# Patient Record
Sex: Female | Born: 1980 | Race: White | Hispanic: No | Smoking: Heavy tobacco smoker
Health system: Southern US, Community
[De-identification: ages and names within clinical notes are randomized; demographics above are authoritative.]

## PROBLEM LIST (undated history)

## (undated) DIAGNOSIS — F419 Anxiety disorder, unspecified: Secondary | ICD-10-CM

## (undated) DIAGNOSIS — I10 Essential (primary) hypertension: Secondary | ICD-10-CM

## (undated) DIAGNOSIS — F32A Depression, unspecified: Secondary | ICD-10-CM

## (undated) DIAGNOSIS — F329 Major depressive disorder, single episode, unspecified: Secondary | ICD-10-CM

## (undated) HISTORY — PX: CHOLECYSTECTOMY: SHX55

---

## 2001-01-17 ENCOUNTER — Other Ambulatory Visit: Admission: RE | Admit: 2001-01-17 | Discharge: 2001-01-17 | Payer: Self-pay | Admitting: Internal Medicine

## 2001-01-29 ENCOUNTER — Encounter: Payer: Self-pay | Admitting: Internal Medicine

## 2001-01-29 ENCOUNTER — Ambulatory Visit (HOSPITAL_COMMUNITY): Admission: RE | Admit: 2001-01-29 | Discharge: 2001-01-29 | Payer: Self-pay | Admitting: Internal Medicine

## 2001-03-17 ENCOUNTER — Emergency Department (HOSPITAL_COMMUNITY): Admission: EM | Admit: 2001-03-17 | Discharge: 2001-03-17 | Payer: Self-pay | Admitting: Emergency Medicine

## 2001-05-09 ENCOUNTER — Ambulatory Visit (HOSPITAL_COMMUNITY): Admission: RE | Admit: 2001-05-09 | Discharge: 2001-05-09 | Payer: Self-pay | Admitting: *Deleted

## 2001-05-09 ENCOUNTER — Encounter: Payer: Self-pay | Admitting: *Deleted

## 2001-07-22 ENCOUNTER — Emergency Department (HOSPITAL_COMMUNITY): Admission: EM | Admit: 2001-07-22 | Discharge: 2001-07-22 | Payer: Self-pay | Admitting: *Deleted

## 2001-10-07 ENCOUNTER — Inpatient Hospital Stay (HOSPITAL_COMMUNITY): Admission: RE | Admit: 2001-10-07 | Discharge: 2001-10-09 | Payer: Self-pay | Admitting: *Deleted

## 2001-11-20 ENCOUNTER — Ambulatory Visit (HOSPITAL_COMMUNITY): Admission: RE | Admit: 2001-11-20 | Discharge: 2001-11-20 | Payer: Self-pay | Admitting: *Deleted

## 2002-02-17 ENCOUNTER — Emergency Department (HOSPITAL_COMMUNITY): Admission: EM | Admit: 2002-02-17 | Discharge: 2002-02-17 | Payer: Self-pay | Admitting: Emergency Medicine

## 2002-09-02 ENCOUNTER — Emergency Department (HOSPITAL_COMMUNITY): Admission: EM | Admit: 2002-09-02 | Discharge: 2002-09-02 | Payer: Self-pay | Admitting: *Deleted

## 2002-09-21 ENCOUNTER — Emergency Department (HOSPITAL_COMMUNITY): Admission: EM | Admit: 2002-09-21 | Discharge: 2002-09-21 | Payer: Self-pay | Admitting: Emergency Medicine

## 2002-09-23 ENCOUNTER — Emergency Department (HOSPITAL_COMMUNITY): Admission: EM | Admit: 2002-09-23 | Discharge: 2002-09-23 | Payer: Self-pay | Admitting: Emergency Medicine

## 2002-11-30 ENCOUNTER — Emergency Department (HOSPITAL_COMMUNITY): Admission: EM | Admit: 2002-11-30 | Discharge: 2002-11-30 | Payer: Self-pay | Admitting: Emergency Medicine

## 2003-02-12 ENCOUNTER — Emergency Department (HOSPITAL_COMMUNITY): Admission: EM | Admit: 2003-02-12 | Discharge: 2003-02-13 | Payer: Self-pay | Admitting: Emergency Medicine

## 2003-02-13 ENCOUNTER — Emergency Department (HOSPITAL_COMMUNITY): Admission: EM | Admit: 2003-02-13 | Discharge: 2003-02-13 | Payer: Self-pay | Admitting: Emergency Medicine

## 2003-04-02 ENCOUNTER — Emergency Department (HOSPITAL_COMMUNITY): Admission: EM | Admit: 2003-04-02 | Discharge: 2003-04-02 | Payer: Self-pay | Admitting: Emergency Medicine

## 2003-04-07 ENCOUNTER — Emergency Department (HOSPITAL_COMMUNITY): Admission: EM | Admit: 2003-04-07 | Discharge: 2003-04-08 | Payer: Self-pay | Admitting: Emergency Medicine

## 2003-06-01 ENCOUNTER — Emergency Department (HOSPITAL_COMMUNITY): Admission: EM | Admit: 2003-06-01 | Discharge: 2003-06-01 | Payer: Self-pay | Admitting: Emergency Medicine

## 2003-07-14 ENCOUNTER — Emergency Department (HOSPITAL_COMMUNITY): Admission: EM | Admit: 2003-07-14 | Discharge: 2003-07-14 | Payer: Self-pay | Admitting: Emergency Medicine

## 2003-09-04 ENCOUNTER — Emergency Department (HOSPITAL_COMMUNITY): Admission: EM | Admit: 2003-09-04 | Discharge: 2003-09-04 | Payer: Self-pay | Admitting: Emergency Medicine

## 2003-10-18 ENCOUNTER — Ambulatory Visit (HOSPITAL_COMMUNITY): Admission: RE | Admit: 2003-10-18 | Discharge: 2003-10-18 | Payer: Self-pay | Admitting: *Deleted

## 2003-12-29 ENCOUNTER — Emergency Department (HOSPITAL_COMMUNITY): Admission: EM | Admit: 2003-12-29 | Discharge: 2003-12-29 | Payer: Self-pay | Admitting: Emergency Medicine

## 2004-08-22 ENCOUNTER — Emergency Department (HOSPITAL_COMMUNITY): Admission: EM | Admit: 2004-08-22 | Discharge: 2004-08-22 | Payer: Self-pay | Admitting: Emergency Medicine

## 2004-08-28 ENCOUNTER — Emergency Department (HOSPITAL_COMMUNITY): Admission: EM | Admit: 2004-08-28 | Discharge: 2004-08-28 | Payer: Self-pay | Admitting: Emergency Medicine

## 2004-09-25 ENCOUNTER — Emergency Department (HOSPITAL_COMMUNITY): Admission: EM | Admit: 2004-09-25 | Discharge: 2004-09-25 | Payer: Self-pay | Admitting: Emergency Medicine

## 2004-11-15 ENCOUNTER — Emergency Department (HOSPITAL_COMMUNITY): Admission: EM | Admit: 2004-11-15 | Discharge: 2004-11-15 | Payer: Self-pay | Admitting: Emergency Medicine

## 2004-11-29 ENCOUNTER — Encounter (HOSPITAL_COMMUNITY): Admission: RE | Admit: 2004-11-29 | Discharge: 2004-12-29 | Payer: Self-pay | Admitting: Orthopaedic Surgery

## 2005-02-01 ENCOUNTER — Emergency Department (HOSPITAL_COMMUNITY): Admission: EM | Admit: 2005-02-01 | Discharge: 2005-02-01 | Payer: Self-pay | Admitting: Emergency Medicine

## 2005-04-12 ENCOUNTER — Observation Stay (HOSPITAL_COMMUNITY): Admission: AD | Admit: 2005-04-12 | Discharge: 2005-04-12 | Payer: Self-pay | Admitting: Family Medicine

## 2005-04-13 ENCOUNTER — Ambulatory Visit (HOSPITAL_COMMUNITY): Admission: RE | Admit: 2005-04-13 | Discharge: 2005-04-13 | Payer: Self-pay | Admitting: General Surgery

## 2005-04-13 ENCOUNTER — Encounter (INDEPENDENT_AMBULATORY_CARE_PROVIDER_SITE_OTHER): Payer: Self-pay | Admitting: General Surgery

## 2005-06-22 ENCOUNTER — Emergency Department (HOSPITAL_COMMUNITY): Admission: EM | Admit: 2005-06-22 | Discharge: 2005-06-23 | Payer: Self-pay | Admitting: Emergency Medicine

## 2005-09-04 ENCOUNTER — Emergency Department (HOSPITAL_COMMUNITY): Admission: EM | Admit: 2005-09-04 | Discharge: 2005-09-04 | Payer: Self-pay | Admitting: Emergency Medicine

## 2005-11-15 ENCOUNTER — Emergency Department (HOSPITAL_COMMUNITY): Admission: EM | Admit: 2005-11-15 | Discharge: 2005-11-15 | Payer: Self-pay | Admitting: Emergency Medicine

## 2005-11-18 ENCOUNTER — Emergency Department (HOSPITAL_COMMUNITY): Admission: EM | Admit: 2005-11-18 | Discharge: 2005-11-18 | Payer: Self-pay | Admitting: Emergency Medicine

## 2005-11-19 ENCOUNTER — Emergency Department (HOSPITAL_COMMUNITY): Admission: EM | Admit: 2005-11-19 | Discharge: 2005-11-19 | Payer: Self-pay | Admitting: Emergency Medicine

## 2005-11-20 ENCOUNTER — Emergency Department (HOSPITAL_COMMUNITY): Admission: EM | Admit: 2005-11-20 | Discharge: 2005-11-20 | Payer: Self-pay | Admitting: Emergency Medicine

## 2005-12-11 IMAGING — CR DG CHEST 2V
2 series · 2 of 2 positions shown · non-contrast
Comparison: none

CLINICAL DATA: Cough, congestion

Chest 2 view:
No previous for comparison . Patchy subsegmental atelectasis or early infiltrate
in the left retrocardiac region. Right lung clear. Heart size and pulmonary
vascularity normal. No effusion. Vascular clips in the right upper abdomen.

[view not recorded (1 of 2)]
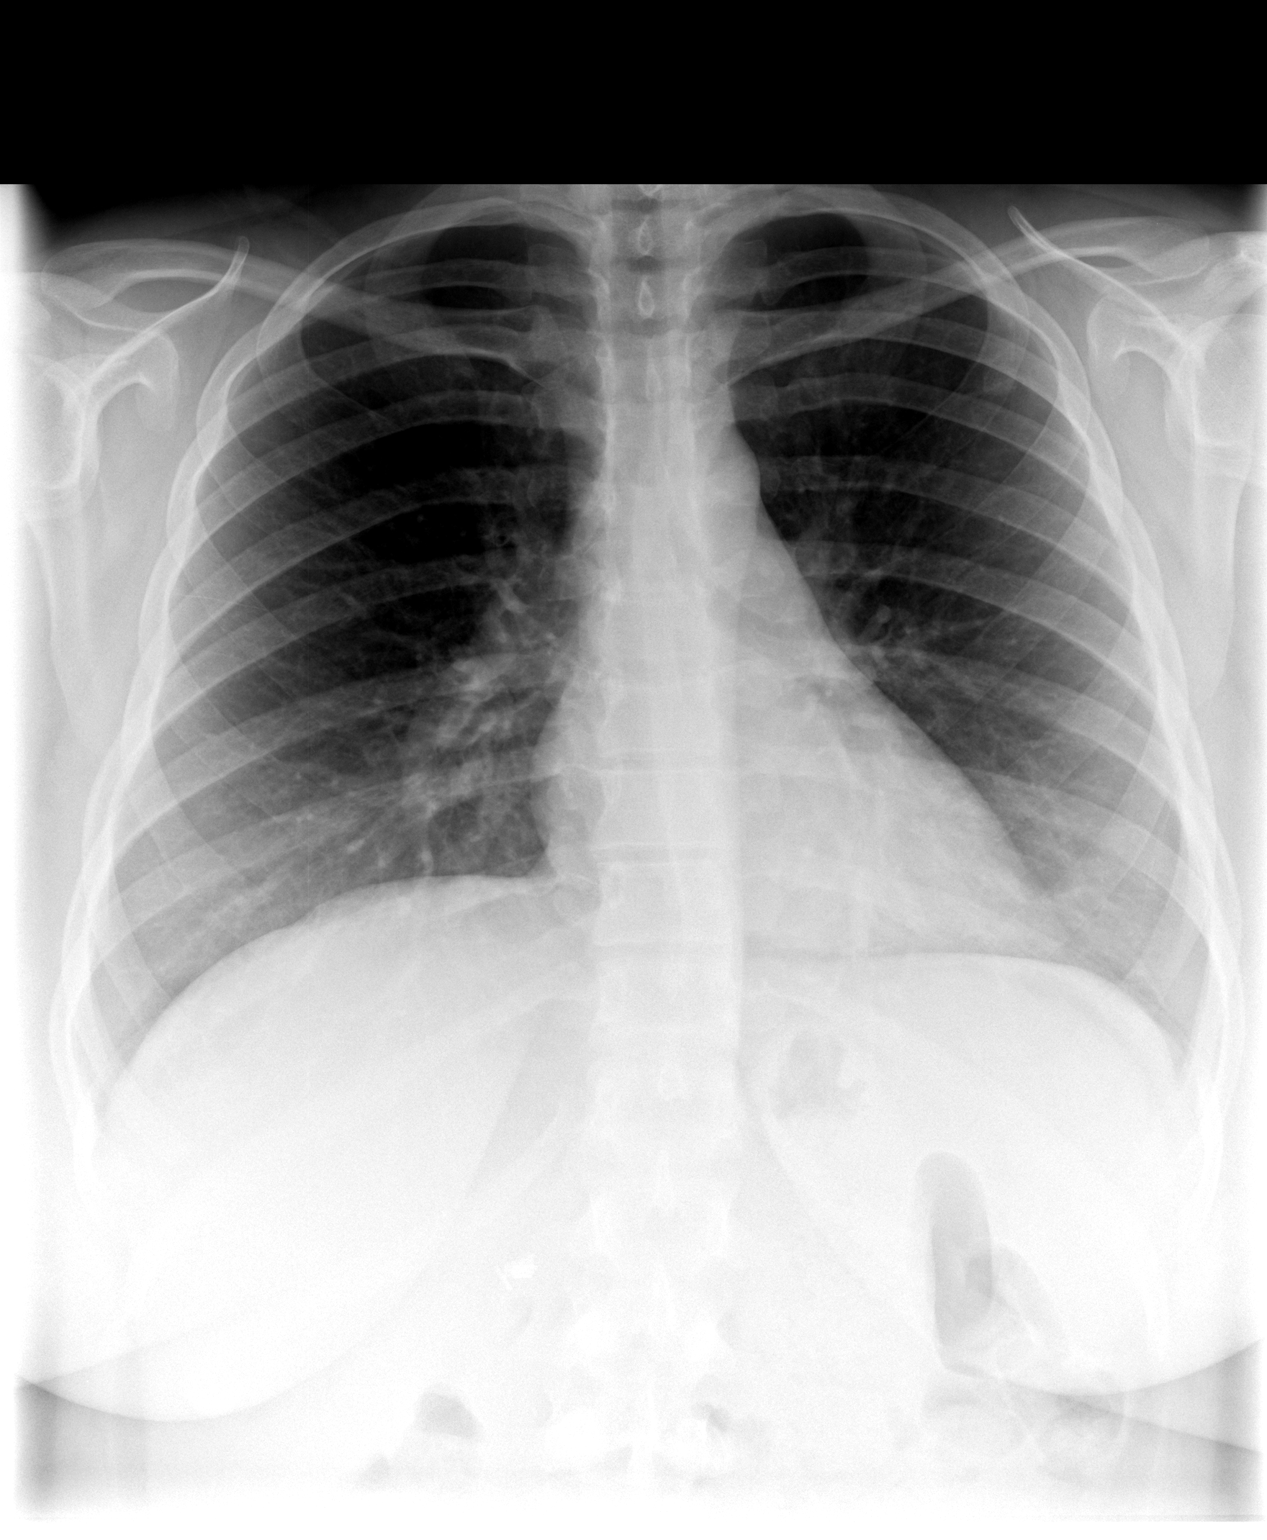

[view not recorded (2 of 2)]
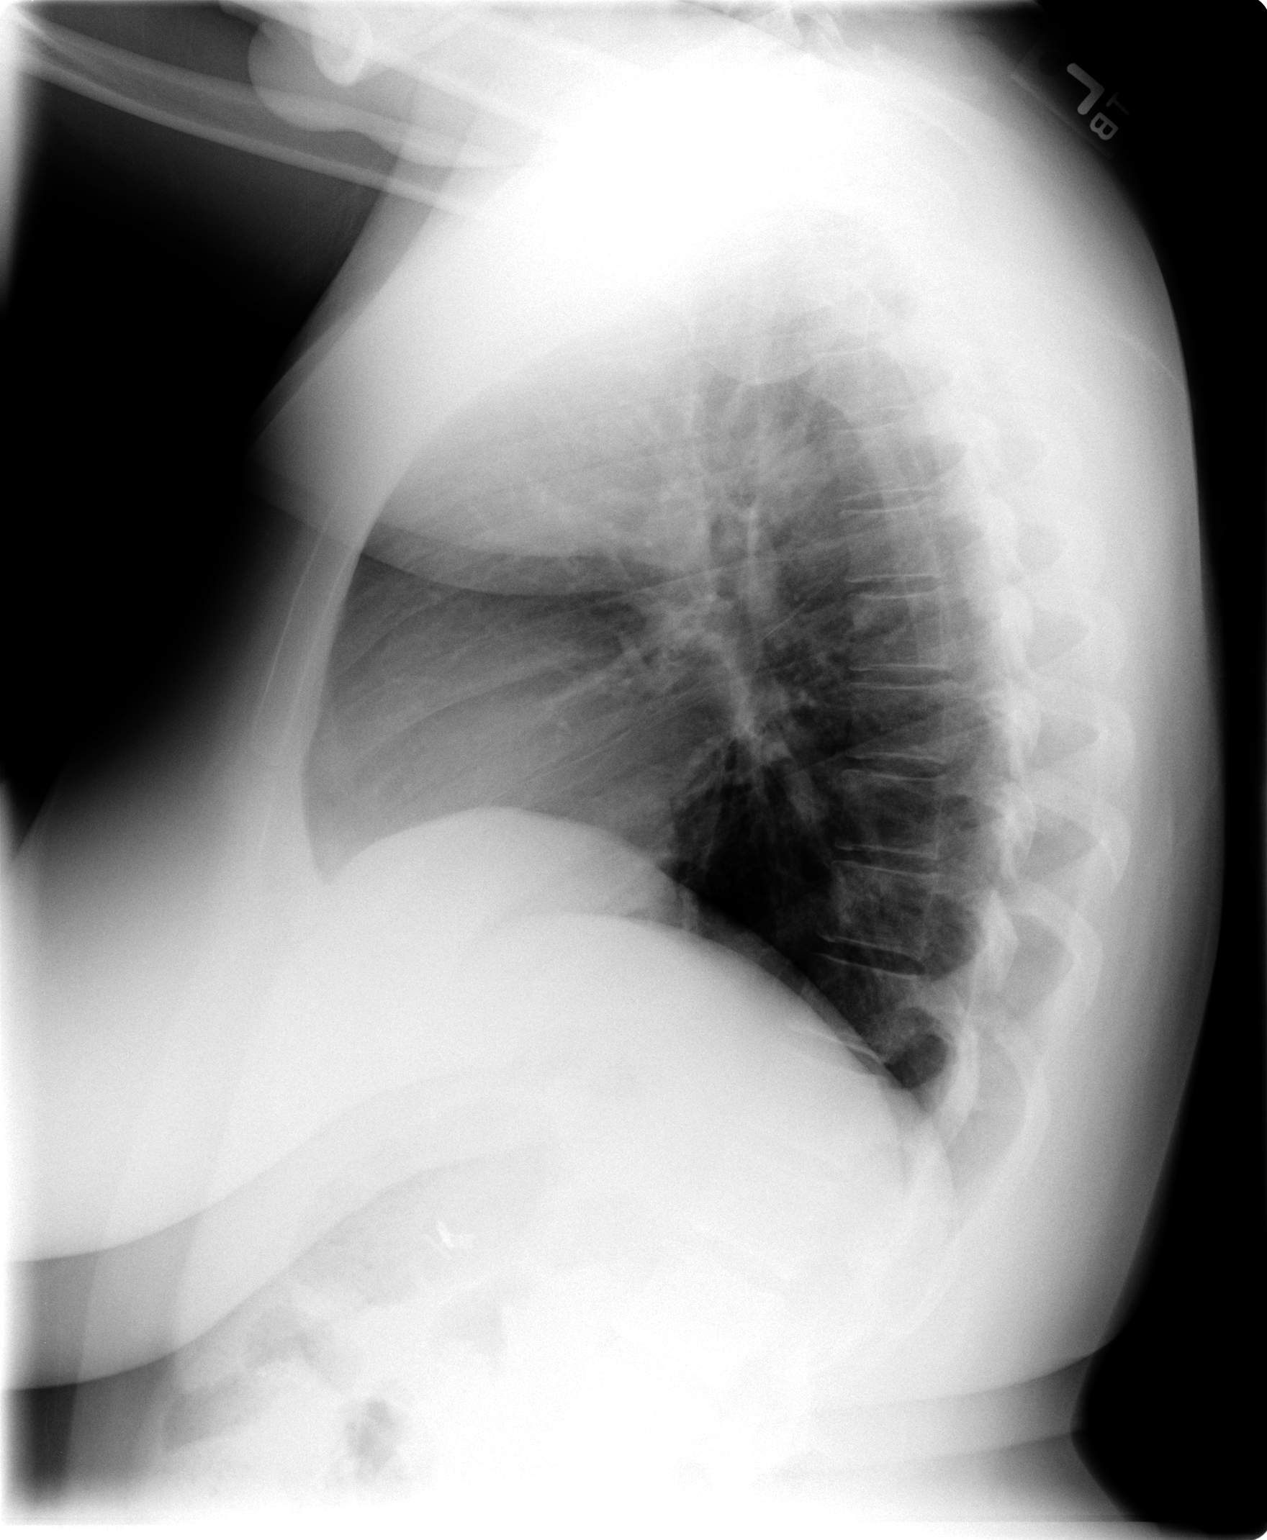

[2 of 2 positions shown; findings below may reference images not displayed]

IMPRESSION: 1. Minimal left retrocardiac subsegmental atelectasis versus early infiltrate.

## 2006-01-13 ENCOUNTER — Emergency Department (HOSPITAL_COMMUNITY): Admission: EM | Admit: 2006-01-13 | Discharge: 2006-01-13 | Payer: Self-pay | Admitting: *Deleted

## 2006-04-28 ENCOUNTER — Emergency Department (HOSPITAL_COMMUNITY): Admission: EM | Admit: 2006-04-28 | Discharge: 2006-04-28 | Payer: Self-pay | Admitting: Emergency Medicine

## 2006-06-25 ENCOUNTER — Emergency Department (HOSPITAL_COMMUNITY): Admission: EM | Admit: 2006-06-25 | Discharge: 2006-06-25 | Payer: Self-pay | Admitting: Emergency Medicine

## 2006-06-25 ENCOUNTER — Emergency Department (HOSPITAL_COMMUNITY): Admission: EM | Admit: 2006-06-25 | Discharge: 2006-06-26 | Payer: Self-pay | Admitting: Emergency Medicine

## 2006-09-28 ENCOUNTER — Emergency Department (HOSPITAL_COMMUNITY): Admission: EM | Admit: 2006-09-28 | Discharge: 2006-09-28 | Payer: Self-pay | Admitting: Emergency Medicine

## 2006-11-07 ENCOUNTER — Emergency Department (HOSPITAL_COMMUNITY): Admission: EM | Admit: 2006-11-07 | Discharge: 2006-11-07 | Payer: Self-pay | Admitting: Emergency Medicine

## 2007-04-01 ENCOUNTER — Emergency Department (HOSPITAL_COMMUNITY): Admission: EM | Admit: 2007-04-01 | Discharge: 2007-04-01 | Payer: Self-pay | Admitting: Emergency Medicine

## 2007-08-08 ENCOUNTER — Emergency Department (HOSPITAL_COMMUNITY): Admission: EM | Admit: 2007-08-08 | Discharge: 2007-08-08 | Payer: Self-pay | Admitting: Emergency Medicine

## 2010-03-03 ENCOUNTER — Emergency Department (HOSPITAL_COMMUNITY): Admission: EM | Admit: 2010-03-03 | Discharge: 2010-03-03 | Payer: Self-pay | Admitting: Emergency Medicine

## 2010-08-26 ENCOUNTER — Emergency Department (HOSPITAL_COMMUNITY)
Admission: EM | Admit: 2010-08-26 | Discharge: 2010-08-26 | Payer: Self-pay | Source: Home / Self Care | Admitting: Emergency Medicine

## 2010-10-13 ENCOUNTER — Emergency Department (HOSPITAL_COMMUNITY)
Admission: EM | Admit: 2010-10-13 | Discharge: 2010-10-13 | Payer: Self-pay | Source: Home / Self Care | Admitting: Emergency Medicine

## 2011-02-02 NOTE — Discharge Summary (Signed)
[facility]  Patient:    Jenna Luna, FORT Visit Number: [id number] MRN: [id number]          Service Type: OBS Location: 4A A417 01 Attending Physician:  Jeri Cos. Dictated by:   Langley Gauss, M.D. Admit Date:  10/07/2001 Discharge Date: 10/09/2001                             Discharge Summary  DIAGNOSES: 1. A 38+ week intrauterine pregnancy. 2. Dysfunctional labor pattern due to inadequate frequency/intensity of    uterine contractions requiring Pitocin augmentation.  PROCEDURE:  Spontaneous assisted vaginal delivery of 6 pound 7 ounce female infant delivered over an intact perineum.  Patient bottle feeding at time of discharge.  LABORATORIES:  RPR is negative.  Hemoglobin and hematocrit 11.4/32.2 with white count 10.6, postpartum day #1 9.5/26.6.  HOSPITAL COURSE:  See previous dictations.  Patient delivered in an uncontrolled manner.  Postpartum she did well.  She bonded well with the infant.  Had excellent family support which will assist her with care of her other children.  DISPOSITION:  Patient is to follow up in the office in four to six weeks time for postpartum visit. Dictated by:   Langley Gauss, M.D. Attending Physician:  Jeri Cos. DD:  10/23/01 TD:  10/23/01 Job: [id number]

## 2011-02-02 NOTE — Discharge Summary (Signed)
[facility]  Patient:    Jenna Luna, Jenna Luna Visit Number: [id number] MRN: [id number]          Service Type: OBS Location: 4A A417 01 Attending Physician:  Jeri Cos. Dictated by:   Langley Gauss, M.D. Admit Date:  10/07/2001 Discharge Date: 10/09/2001                             Discharge Summary  DIAGNOSES: 1. Thirty-eight-plus-week intrauterine pregnancy, in early labor. 2. Dysfunctional labor pattern requiring Pitocin augmentation due to    inadequate frequency and intensity of uterine contractions.  PROCEDURE:  Delivery performed on October 07, 2001, spontaneous assisted vaginal delivery, 6-pound 7-ounce female infant delivered over an intact perineum.  DISCHARGE INSTRUCTIONS:  The patient is bottle-feeding at the time of discharge.  FOLLOW-UP:  She will follow up in the office in four to six weeks time.  LABORATORY DATA:  RPR nonreactive.  Hemoglobin and hematocrit 11.4/32.2, white count 10.6.  Postpartum day #1 hemoglobin 9.5, hematocrit 26.6.  HOSPITAL COURSE:  See previous dictations.  The patient was admitted the early a.m. of October 07, 2001.  The patient thereafter labored very quickly to a vaginal delivery on October 07, 2001, without complications.  Postpartum the patient did very well.  She bonded well with the infant, had no postpartum complications, and was thus discharged to home, given copy of standardized discharge instructions. Dictated by:   Langley Gauss, M.D. Attending Physician:  Jeri Cos. DD:  10/17/01 TD:  10/18/01 Job: [id number]

## 2011-02-02 NOTE — H&P (Signed)
Jenna Luna, Jenna Luna               ACCOUNT NO.:  [id number]   MEDICAL RECORD NO.:  [id number]           PATIENT TYPE:  OBV   LOCATION:  A319                          FACILITY:  [facility]   PHYSICIAN:  Annia Friendly. 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  Chief complaint is rectal  bleeding x1 day.   HISTORY OF PRESENT ILLNESS:  Patient saw bright red blood in commode on March 11, 2005.  She estimates amount of blood in commode of about 1  tablespoonful.  She saw blood also secondary to wiping anal area.  History  is negative for constipation, melena, abdominal pain, trauma to rectal area,  shortness of breath, dizziness, syncope, and weight loss.   MEDICAL HISTORY:  Medical history, according to patient, is positive for  hemorrhoidal disease.  Medical history is negative for peptic ulcer disease,  inflammatory bowel disease, bleeding disorder, etc.  Medical history is  positive for chronic depression with anxiety.  Medical history is negative  for hypertension, diabetes, tuberculosis, cancer, cystic fibrosis, asthma,  seizure disorder.   Prescribed meds on admission are Lexapro 20 mg p.o. once daily and Xanax 0.5  mg p.o. t.i.d.  The patient is not allergic to any known medication.  Habits  is positive for cigarette smoker one half pack per day (stated at age 55).  Habits are negative for use of ethanol and street drugs.   Past medical history positive for hospitalization for pregnancy and  bilateral tubal ligation at age 71 by Dr. Lisette Grinder, and cholecystectomy at  age 22 due to gallstones by Dr. Lovell Sheehan.   FAMILY HISTORY:  Family history revealed mother living age 25 with history  of diabetes mellitus, father living age 32 with history of heart disease,  two  sisters living age 25 with history of hypertension, age 11 good health;  one son living age 7 good health; three daughters living age 63, 17, and 5  good health.   REVIEW OF SYSTEMS:  Review of systems is negative for unexplained fever,  epistaxis, bleeding gums, hemoptysis, wheezing, hematemesis, dysuria, gross  hematuria, edema of legs, joint pain, joint swelling, joint hotness,  dysphagia, etc.  Review of systems positive for periods of melancholy,  episodic headache, trouble staying asleep.  Review of systems is also  negative for suicidal ideation.  Last menstrual period was March 25, 2005 x3  days.   PHYSICAL EXAMINATION:  GENERAL APPEARANCE:  Young adult, overweight, medium  height, alert, white female in no apparent respiratory distress.  HEAD:  Normocephalic.  EARS:  Normal auricles.  External canals patent.  Tympanic membranes pearly  gray.  EYES:  Lids negative for ptosis.  Sclerae white.  Pupils round, equal, and  reactive to light.  Extraocular movement intact.  NOSE:  Negative for discharge.  MOUTH:  No oral lesion.  No  bleeding gum.  Dentition good.  Posterior  pharynx benign.  NECK:  Negative for lymphadenopathy or thyromegaly.  Supraclavicular space  no palpable nodes.  LUNGS:  Clear.  HEART:  Audible S1, S2, without murmur, regular rate and rhythm.  ABDOMEN:  Obese, hyperactive bowel sounds, soft, positive for left lower  quadrant tenderness with deep palpation.  No rebound tenderness.  No  palpable mass or organomegaly.  EXTERNAL GENITALIA:  Normal female.  RECTAL:  No external lesion.  Digit exam:  No palpable rectal vault masses.  Stool positive for a bloody tinge and guaiac positive.  EXTREMITIES:  No edema.  No joint swelling.  No joint redness.  No joint  hotness.  NEUROLOGIC:  Alert and oriented to person, place, and time.  Cranial nerves  II-XII appeared intact.   IMPRESSION:  Acute hematochezia.   SECONDARY DIAGNOSIS:  Chronic depression with anxiety.    PLAN:  IV fluid, diagnostic colonoscopy on April 13, 2005 by Dr. Maggie Schwalbe,  prep for colonoscopy, diet clear liquid, activity as tolerated.  Alprazolam  0.5 mg sublingually t.i.d.  Consent form for colonoscopy.  Labs:  CBC, MET-  7, liver function tests, urinalysis, urine culture and CEA level.   DISCUSSION:  The purpose of the procedure and its complication has been  discussed with the patient.  The patient is in agreement for the procedure  to be done by Dr. Maggie Schwalbe on March 14, 2005.       GKH/MEDQ  D:  04/11/2005  T:  04/11/2005  Job:  [id number]

## 2011-02-02 NOTE — Op Note (Signed)
[facility]  Patient:    Jenna Luna, Jenna Luna Visit Number: [id number] MRN: [id number]          Service Type: DSU Location: DAY Attending Physician:  Jeri Cos. Dictated by:   Roylene Reason. Lisette Grinder, M.D. Proc. Date: 11/20/01 Admit Date:  11/20/2001 Discharge Date: 11/20/2001   CC:         Maudry Diego, M.D.  Patrica Duel, M.D.   Operative Report  PREOPERATIVE DIAGNOSIS:  Multiparity, desires permanent sterilization.  POSTOPERATIVE DIAGNOSIS:  Multiparity desires permanent sterilization.  OPERATION/PROCEDURE:  Laparoscopic tubal ligation utilizing bipolar cautery.  SURGEON:  Roylene Reason. Lisette Grinder, M.D.  ESTIMATED BLOOD LOSS:  Minimal  COMPLICATIONS:  None.  SPECIMENS:  None.  FINDINGS:  At the time of surgery include a multiparous sized uterus, normal tubes and ovaries bilaterally.  DESCRIPTION OF PROCEDURE:  The patient was taken to the operating room.  Vital signs were stable and the patient underwent uncomplicated induction of general endotracheal anesthesia after which time she was placed in the dorsal lithotomy position, prepped and draped in the usual sterile manner.  A red rubber catheter was used to drain about 50 cc of clear yellow urine from the bladder.  The speculum was then placed within the vaginal vault.  The cervix was visualized and the anterior lip of the cervix was grasped with a single-tooth tenaculum.  A Hulka intrauterine manipulator was then passed through the endocervical os and used to clamp the cervix at 6 oclock.  I then changed to sterile gloves and standing at the patients left side, a 1-cm, vertical incision was made just inferior to the umbilicus.  Likewise a transverse suprapubic incision was made.  The abdominal wall was then elevated.  The Verres needle was then passed through the subumbilical incision angling towards the hollow of the sacrum.  This was done atraumatically.  A pneumoperitoneum was then  created by insufflating 3 L of CO2 gas at a filling pressure of less than 15 mmHg.  A drop test then confirms the proper intraperitoneal placement.  After an adequate pneumoperitoneum the Verres needle was removed.  The abdominal wall was then elevated.  The 10-mm shielded, disposable trocar was then utilized passing through the subumbilical incision angling towards the hollow of the sacrum.  This was done atraumatically.  The trocar was then removed.  Diagnostic scope was inserted which does confirm a proper intraperitoneal placement with no apparent bowel or vascular injury.  Under direct visualization, then a 5 mm trocar and sleeve were inserted suprapubically.  Utilizing a shield and trocar this was likewise done atraumatically.  The Kleppinger cauterization forceps were then introduced through the suprapubic sleeve.  This then allows bilateral cauterization of the tubes. Each of these was cauterized according to the technique described by Kleppinger with the most proximal being 1 cm from the tubal uterine junction. All 3 cauterizations are in direct continuity with each other and, at all times, the cauterization current was continued until the LAD lights plateaued down on 3.  This does result in extensive tubular destruction bilaterally. Proper verification of the tubes was made by tracing them out to their fimbriated ends to confirm tubal anatomy.  Ovaries were noted to be normal in appearance.  Uterus likewise normal in appearance.  No adhesive disease or endometriosis is identified.  The pneumoperitoneum was then allowed to escape as our instruments are removed and the incision sites are closed utilizing #0 Vicryl suture in deep interrupted fashion through the fascial layers.  Band-Aids  were then placed over the skin.  The patient tolerated the procedure very well.  She was extubated and taken to the recovery room in stable condition.  No family members were available to  discuss operative procedure following the performance.  The patient is discharged to home on the same day of service.  She had previously been given a prescription for Lortab 10/500.  Permanent laboratory studies included a urine pregnancy test:  Negative.  HOSPITAL COURSE:  LTC performed without complications.  Postoperatively the patient did well.  Vital signs remained stable.  She satisfied all criteria prior to discharge and was thus discharged home on the same day of service, 11/20/01 given a copy of standarized discharge instructions. Dictated by: Roylene Reason. Lisette Grinder, M.D. Attending Physician:  Jeri Cos. DD:  11/21/01 TD:  11/23/01 Job: [id number]

## 2011-02-02 NOTE — Op Note (Signed)
[facility]  Patient:    Jenna Luna, Jenna Luna Visit Number: [id number] MRN: [id number]          Service Type: OBS Location: 4A A417 01 Attending Physician:  Jeri Cos. Dictated by:   Langley Gauss, M.D. Proc. Date: 10/07/01 Admit Date:  10/07/2001                             Operative Report  DIAGNOSES: 1. A 38+ week intrauterine pregnancy. 2. Dysfunctional labor requiring Pitocin augmentation due to inadequate    frequency and intensity of uterine contractions.  DELIVERY PERFORMED:  Spontaneous assisted vaginal delivery of a 7 pound 6 ounce female infant delivered over an intact perineum.  COMPLICATIONS:  None.  SPECIMENS:  Arterial cord gas and cord blood to pathology laboratory. Placenta is examined, noted to be apparently intact with a three vessel umbilical cord.  SUMMARY:  Patient is a gravida 4, para 3 with a history of very rapid and precipitous labor and delivery on all three of her previous pregnancies. Thus, with her noted to have a very favorable cervix, she was admitted for induction on todays date.  Patient upon examination in labor and delivery is noted to be 3 cm dilated, 80% effaced, vertex at a -1 station, but well applied to the cervix.  External fetal monitor reveals a reassuring fetal heart rate with accelerations noted.  There is evidence of very mild uterine activity with contractions occurring q.5-7 minutes.  After admission amniotomy is performed.  Placement of fetal scalp electrode is performed.  Clear amniotic fluid is noted.  Patient was observed for several hours.  Was noted to continue to have inadequate labor.  Thus, Pitocin augmentation was initiated.  With onset of active labor and discomfort associated with uterine contractions, patient requested IV analgesia only.  She was treated on two occasions with IV Stadol.  After ______ I examined the patient following nursing staff.  She was noted to be 8 cm  dilated with a contraction, +1 station with the cervix completely effaced.  She did have a very strong urge to push.  Thus, with subsequent contractions the patient is encouraged to push.  The cervix easily slips over the infants vertex to complete dilatation.  She is then placed in the dorsal lithotomy position, prepped and draped in the usual sterile manner.  She pushed very well during this short second stage of labor to deliver in a direct OA position over an intact perineum.  Mouth and nares of the infant were bulb suctioned of clear amniotic fluid.  Renewed expulsive efforts resulted in spontaneous rotation to a left anterior shoulder position.  Gentle abdominal retraction combined with expulsive efforts resulted in delivery of the remainder of the infant without difficulty.  The umbilical cord is then milked towards the infant.  Cord is doubly clamped and cut and infant is placed on maternal abdomen for immediate bonding purposes.  Arterial cord gas and cord blood are then obtained.  Down traction on umbilical cord results in separation which upon examination appears to be an intact placenta with a three vessel umbilical cord. Examination of the genital tract reveals no periurethral, no perineal lacerations.  Excellent uterine tone is achieved.  Thus, the patient is taken out of the dorsal lithotomy position and allowed to bond with the infant. Pertinently, the patient does plan on bottle feeding this infant. Dictated by:   Langley Gauss, M.D. Attending Physician:  Langley Gauss  P. DD:  10/07/01 TD:  10/08/01 Job: [id number]

## 2011-07-03 DIAGNOSIS — I10 Essential (primary) hypertension: Secondary | ICD-10-CM | POA: Insufficient documentation

## 2015-03-20 ENCOUNTER — Encounter: Payer: Self-pay | Admitting: Emergency Medicine

## 2015-03-20 ENCOUNTER — Emergency Department (INDEPENDENT_AMBULATORY_CARE_PROVIDER_SITE_OTHER)
Admission: EM | Admit: 2015-03-20 | Discharge: 2015-03-20 | Disposition: A | Discharge status: Home / Self Care | Payer: BLUE CROSS/BLUE SHIELD | Source: Home / Self Care | Attending: Family Medicine | Admitting: Family Medicine

## 2015-03-20 DIAGNOSIS — H9203 Otalgia, bilateral: Secondary | ICD-10-CM

## 2015-03-20 DIAGNOSIS — J069 Acute upper respiratory infection, unspecified: Secondary | ICD-10-CM

## 2015-03-20 HISTORY — DX: Essential (primary) hypertension: I10

## 2015-03-20 HISTORY — DX: Major depressive disorder, single episode, unspecified: F32.9

## 2015-03-20 HISTORY — DX: Depression, unspecified: F32.A

## 2015-03-20 MED ORDER — PSEUDOEPHEDRINE HCL 60 MG PO TABS: 60.0000 mg | ORAL_TABLET | Freq: Four times a day (QID) | ORAL | Status: DC | PRN

## 2015-03-20 MED ORDER — SALINE SPRAY 0.65 % NA SOLN: 1.0000 | NASAL | Status: DC | PRN

## 2015-03-20 MED ORDER — OXYMETAZOLINE HCL 0.05 % NA SOLN: 1.0000 | Freq: Two times a day (BID) | NASAL | Status: DC

## 2015-03-20 NOTE — Discharge Instructions (Signed)
Cool Mist Vaporizers °Vaporizers may help relieve the symptoms of a cough and cold. They add moisture to the air, which helps mucus to become thinner and less sticky. This makes it easier to breathe and cough up secretions. Cool mist vaporizers do not cause serious burns like hot mist vaporizers, which may also be called steamers or humidifiers. Vaporizers have not been proven to help with colds. You should not use a vaporizer if you are allergic to mold. °HOME CARE INSTRUCTIONS °· Follow the package instructions for the vaporizer. °· Do not use anything other than distilled water in the vaporizer. °· Do not run the vaporizer all of the time. This can cause mold or bacteria to grow in the vaporizer. °· Clean the vaporizer after each time it is used. °· Clean and dry the vaporizer well before storing it. °· Stop using the vaporizer if worsening respiratory symptoms develop. °Document Released: 05/31/2004 Document Revised: 09/08/2013 Document Reviewed: 01/21/2013 °ExitCare® Patient Information ©2015 [employer]. This information is not intended to replace advice given to you by your health care provider. Make sure you discuss any questions you have with your health care provider. ° °

## 2015-03-20 NOTE — ED Provider Notes (Signed)
CSN: [id number]     Arrival date & time 03/20/15  1707 History   First MD Initiated Contact with Patient 03/20/15 1708     Chief Complaint  Patient presents with  . Otalgia  . Nasal Congestion   (Consider location/radiation/quality/duration/timing/severity/associated sxs/prior Treatment) HPI Patient is a [age] year old female presenting to urgent care with complaint of cough congestion and bilateral ear pain that started yesterday. Patient states she feels "miserable."  Pain in ears as constant pressure-like moderate in severity no relief with ibuprofen at home. Also reports nasal congestion and mild intermittent dry cough. Denies fevers, chills, nausea, vomiting, or diarrhea. Denies difficulty breathing or swallowing.  Denies sick contacts or recent travel.  Past Medical History  Diagnosis Date  . Hypertension   . Depression    Past Surgical History  Procedure Laterality Date  . Cholecystectomy     History reviewed. No pertinent family history. History  Substance Use Topics  . Smoking status: Heavy Tobacco Smoker  . Smokeless tobacco: Not on file  . Alcohol Use: No   OB History    No data available     Review of Systems  Constitutional: Positive for fatigue. Negative for fever, chills and diaphoresis.  HENT: Positive for congestion, ear pain ( bilateral), postnasal drip and rhinorrhea. Negative for sore throat, trouble swallowing and voice change.   Respiratory: Positive for cough. Negative for shortness of breath, wheezing and stridor.   Gastrointestinal: Negative for nausea, vomiting, abdominal pain and diarrhea.  Musculoskeletal: Negative for myalgias, back pain and arthralgias.  Neurological: Positive for headaches. Negative for dizziness and light-headedness.    Allergies  Review of patient's allergies indicates not on file.  Home Medications   Prior to Admission medications   Medication Sig Start Date End Date Taking? Authorizing Provider  ALPRAZolam Prudy Feeler(XANAX) 1 MG  tablet Take 1 mg by mouth at bedtime as needed for anxiety.   Yes Historical Provider, MD  dexamethasone (DECADRON) 0.1 % ophthalmic suspension Place into both eyes 2 (two) times daily.   Yes Historical Provider, MD  oxymetazoline (AFRIN NASAL SPRAY) 0.05 % nasal spray Place 1 spray into both nostrils 2 (two) times daily. Do not exceed 3 consecutive days 03/20/15   Junius FinnerErin O'Malley, PA-C  pseudoephedrine (SUDAFED) 60 MG tablet Take 1 tablet (60 mg total) by mouth every 6 (six) hours as needed for congestion. 03/20/15   Junius FinnerErin O'Malley, PA-C  sodium chloride (OCEAN) 0.65 % SOLN nasal spray Place 1 spray into both nostrils as needed for congestion. 03/20/15   Junius FinnerErin O'Malley, PA-C   BP 126/81 mmHg  Pulse 72  Temp(Src) 98.2 F (36.8 C) (Oral)  Resp 16  Ht 5\' 2"  (1.575 m)  Wt 244 lb 8 oz (110.904 kg)  BMI 44.71 kg/m2  SpO2 96%  LMP 02/25/2015 (Exact Date) Physical Exam  Constitutional: She appears well-developed and well-nourished. No distress.  HENT:  Head: Normocephalic and atraumatic.  Right Ear: Hearing, tympanic membrane, external ear and ear canal normal.  Left Ear: Hearing, tympanic membrane, external ear and ear canal normal.  Nose: Mucosal edema and rhinorrhea present. Right sinus exhibits no maxillary sinus tenderness and no frontal sinus tenderness. Left sinus exhibits no maxillary sinus tenderness and no frontal sinus tenderness.  Mouth/Throat: Uvula is midline, oropharynx is clear and moist and mucous membranes are normal.  Eyes: Conjunctivae are normal. No scleral icterus.  Neck: Normal range of motion.  Cardiovascular: Normal rate, regular rhythm and normal heart sounds.   Pulmonary/Chest: Effort normal and breath sounds normal.  No respiratory distress. She has no wheezes. She has no rales. She exhibits no tenderness.  Abdominal: Soft. Bowel sounds are normal. She exhibits no distension and no mass. There is no tenderness. There is no rebound and no guarding.  Musculoskeletal: Normal range  of motion.  Neurological: She is alert.  Skin: Skin is warm and dry. She is not diaphoretic.  Nursing note and vitals reviewed.   ED Course  Procedures (including critical care time) Labs Review Labs Reviewed - No data to display  Imaging Review No results found.   MDM   1. Acute upper respiratory infection   2. Ear pain, bilateral     Patient is a 34 year old female presenting with URI type symptoms. Patient appears well and nontoxic is afebrile with O2 of 96% on room air.  Lungs clear to auscultation bilaterally. Doubt pneumonia no indication for chest x-ray at this time. No evidence of strep throat at this time. No evidence of otitis media at this time. We'll treat symptomatically. Rx: Sudafed and Afrin. Advised pt to use acetaminophen and ibuprofen as needed for fever and pain. Encouraged rest and fluids.  Advised to follow-up with PCP at the end of the week if not improving. Return precautions provided. Pt verbalized understanding and agreement with tx plan.     Junius Finner, PA-C 03/20/15 1758

## 2015-03-20 NOTE — ED Notes (Signed)
[facility] with [facility]/O cough cold and congestion since yesterday

## 2019-03-13 DIAGNOSIS — F411 Generalized anxiety disorder: Secondary | ICD-10-CM | POA: Insufficient documentation

## 2019-12-08 DIAGNOSIS — Z6841 Body Mass Index (BMI) 40.0 and over, adult: Secondary | ICD-10-CM | POA: Insufficient documentation

## 2020-10-10 DIAGNOSIS — F332 Major depressive disorder, recurrent severe without psychotic features: Secondary | ICD-10-CM | POA: Insufficient documentation

## 2021-01-02 ENCOUNTER — Encounter: Payer: Self-pay | Admitting: Emergency Medicine

## 2021-01-02 ENCOUNTER — Ambulatory Visit (INDEPENDENT_AMBULATORY_CARE_PROVIDER_SITE_OTHER): Payer: BLUE CROSS/BLUE SHIELD

## 2021-01-02 ENCOUNTER — Other Ambulatory Visit: Payer: Self-pay

## 2021-01-02 ENCOUNTER — Ambulatory Visit
Admission: EM | Admit: 2021-01-02 | Discharge: 2021-01-02 | Disposition: A | Discharge status: Home / Self Care | Payer: BLUE CROSS/BLUE SHIELD

## 2021-01-02 DIAGNOSIS — S8001XA Contusion of right knee, initial encounter: Secondary | ICD-10-CM

## 2021-01-02 DIAGNOSIS — M79661 Pain in right lower leg: Secondary | ICD-10-CM

## 2021-01-02 DIAGNOSIS — S8002XA Contusion of left knee, initial encounter: Secondary | ICD-10-CM

## 2021-01-02 DIAGNOSIS — W19XXXA Unspecified fall, initial encounter: Secondary | ICD-10-CM

## 2021-01-02 DIAGNOSIS — M25461 Effusion, right knee: Secondary | ICD-10-CM

## 2021-01-02 NOTE — ED Triage Notes (Signed)
Missed a step on Thursday and fell.  Pain to right knee.  Hurts to the touch knee swollen

## 2021-10-19 ENCOUNTER — Other Ambulatory Visit: Payer: Self-pay

## 2021-10-19 ENCOUNTER — Encounter: Payer: Self-pay | Admitting: Emergency Medicine

## 2021-10-19 ENCOUNTER — Ambulatory Visit
Admission: EM | Admit: 2021-10-19 | Discharge: 2021-10-19 | Disposition: A | Discharge status: Home / Self Care | Payer: Self-pay | Attending: Family Medicine | Admitting: Family Medicine

## 2021-10-19 DIAGNOSIS — N390 Urinary tract infection, site not specified: Secondary | ICD-10-CM | POA: Insufficient documentation

## 2021-10-19 LAB — POCT URINALYSIS DIP (MANUAL ENTRY)
Bilirubin, UA: NEGATIVE
Glucose, UA: NEGATIVE mg/dL
Ketones, POC UA: NEGATIVE mg/dL
Nitrite, UA: POSITIVE — AB
Protein Ur, POC: 30 mg/dL — AB
Spec Grav, UA: 1.025 (ref 1.010–1.025)
Urobilinogen, UA: 0.2 E.U./dL
pH, UA: 6 (ref 5.0–8.0)

## 2021-10-19 MED ORDER — CEPHALEXIN 500 MG PO CAPS: 500.0000 mg | ORAL_CAPSULE | Freq: Two times a day (BID) | ORAL | 0 refills | Status: DC

## 2021-10-19 MED ORDER — PHENAZOPYRIDINE HCL 200 MG PO TABS: 200.0000 mg | ORAL_TABLET | Freq: Three times a day (TID) | ORAL | 0 refills | Status: DC

## 2021-10-19 NOTE — ED Provider Notes (Signed)
[facility]    CSN: [id number] Arrival date & time: 10/19/21  1606      History   Chief Complaint No chief complaint on file.   HPI Jenna Luna is a 41 y.o. female.   Presenting today with 1 day history of dysuria, urinary frequency, mild suprapubic pressure.  Denies fever, chills, abdominal pain, nausea, vomiting, hematuria, vaginal discharge or irritation.  So far not trying anything over-the-counter for symptoms.   Past Medical History:  Diagnosis Date   Depression    Hypertension     There are no problems to display for this patient.   Past Surgical History:  Procedure Laterality Date   CHOLECYSTECTOMY      OB History   No obstetric history on file.      Home Medications    Prior to Admission medications   Medication Sig Start Date End Date Taking? Authorizing Provider  cephALEXin (KEFLEX) 500 MG capsule Take 1 capsule (500 mg total) by mouth 2 (two) times daily. 10/19/21  Yes Volney American, PA-C  phenazopyridine (PYRIDIUM) 200 MG tablet Take 1 tablet (200 mg total) by mouth 3 (three) times daily. 10/19/21  Yes Volney American, PA-C  ALPRAZolam Duanne Moron) 1 MG tablet Take 1 mg by mouth at bedtime as needed for anxiety.    [provider]  propranolol (INDERAL) 20 MG tablet Take 20 mg by mouth daily.    [provider]  sertraline (ZOLOFT) 100 MG tablet Take 150 mg by mouth daily.    [provider]  triamterene-hydrochlorothiazide (MAXZIDE) 75-50 MG tablet Take 1 tablet by mouth daily.    [provider]  sodium chloride (OCEAN) 0.65 % SOLN nasal spray Place 1 spray into both nostrils as needed for congestion. 03/20/15 01/02/21  Noe Gens, PA-C    Family History Family History  Problem Relation Age of Onset   Thyroid disease Mother    Hypertension Mother    Hypertension Father    Hyperlipidemia Father     Social History Social History   Tobacco Use   Smoking status: Heavy Smoker    Smokeless tobacco: Never  Substance Use Topics   Alcohol use: No     Allergies   Patient has no known allergies.   Review of Systems Review of Systems Per HPI  Physical Exam Triage Vital Signs ED Triage Vitals  Enc Vitals Group     BP 10/19/21 1656 140/87     Pulse Rate 10/19/21 1656 74     Resp 10/19/21 1656 18     Temp 10/19/21 1656 98.5 F (36.9 C)     Temp Source 10/19/21 1656 Oral     SpO2 10/19/21 1656 97 %     Weight --      Height --      Head Circumference --      Peak Flow --      Pain Score 10/19/21 1657 3     Pain Loc --      Pain Edu? --      Excl. in [place]? --    No data found.  Updated Vital Signs BP 140/87 (BP Location: Right Arm)    Pulse 74    Temp 98.5 F (36.9 C) (Oral)    Resp 18    LMP 09/28/2021 (Approximate)    SpO2 97%   Visual Acuity Right Eye Distance:   Left Eye Distance:   Bilateral Distance:    Right Eye Near:   Left Eye  Near:    Bilateral Near:     Physical Exam Vitals and nursing note reviewed.  Constitutional:      Appearance: Normal appearance. She is not ill-appearing.  HENT:     Head: Atraumatic.  Eyes:     Extraocular Movements: Extraocular movements intact.     Conjunctiva/sclera: Conjunctivae normal.  Cardiovascular:     Rate and Rhythm: Normal rate and regular rhythm.     Heart sounds: Normal heart sounds.  Pulmonary:     Effort: Pulmonary effort is normal.     Breath sounds: Normal breath sounds.  Abdominal:     General: Bowel sounds are normal. There is no distension.     Palpations: Abdomen is soft.     Tenderness: There is no abdominal tenderness. There is no right CVA tenderness, left CVA tenderness or guarding.  Musculoskeletal:        General: Normal range of motion.     Cervical back: Normal range of motion and neck supple.  Skin:    General: Skin is warm and dry.  Neurological:     Mental Status: She is alert and oriented to person, place, and time.  Psychiatric:        Mood and Affect: Mood  normal.        Thought Content: Thought content normal.        Judgment: Judgment normal.     UC Treatments / Results  Labs (all labs ordered are listed, but only abnormal results are displayed) Labs Reviewed  POCT URINALYSIS DIP (MANUAL ENTRY) - Abnormal; Notable for the following components:      Result Value   Clarity, UA cloudy (*)    Blood, UA moderate (*)    Protein Ur, POC =30 (*)    Nitrite, UA Positive (*)    Leukocytes, UA Small (1+) (*)    All other components within normal limits  URINE CULTURE    EKG   Radiology No results found.  Procedures Procedures (including critical care time)  Medications Ordered in UC Medications - No data to display  Initial Impression / Assessment and Plan / UC Course  I have reviewed the triage vital signs and the nursing notes.  Pertinent labs & imaging results that were available during my care of the patient were reviewed by me and considered in my medical decision making (see chart for details).     UA showing evidence of a urinary tract infection, urine culture pending, will treat with Keflex, Pyridium in the meantime.  Discussed increasing fluid intake, return precautions.  Final Clinical Impressions(s) / UC Diagnoses   Final diagnoses:  Acute lower UTI   Discharge Instructions   None    ED Prescriptions     Medication Sig Dispense Auth. Provider   cephALEXin (KEFLEX) 500 MG capsule Take 1 capsule (500 mg total) by mouth 2 (two) times daily. 10 capsule Volney American, [place]   phenazopyridine (PYRIDIUM) 200 MG tablet Take 1 tablet (200 mg total) by mouth 3 (three) times daily. 6 tablet Volney American, [place]      PDMP not reviewed this encounter.   Volney American, [place] 10/19/21 1739

## 2021-10-19 NOTE — ED Triage Notes (Signed)
Burning on urination that started today.

## 2021-10-22 LAB — URINE CULTURE: Culture: >=100000 — AB

## 2022-01-23 ENCOUNTER — Other Ambulatory Visit (HOSPITAL_COMMUNITY): Payer: Self-pay | Admitting: Adult Health Nurse Practitioner

## 2022-01-23 ENCOUNTER — Ambulatory Visit (HOSPITAL_COMMUNITY)
Admission: RE | Admit: 2022-01-23 | Discharge: 2022-01-23 | Disposition: A | Discharge status: Home / Self Care | Payer: 59 | Source: Ambulatory Visit | Attending: Adult Health Nurse Practitioner | Admitting: Adult Health Nurse Practitioner

## 2022-01-23 DIAGNOSIS — M542 Cervicalgia: Secondary | ICD-10-CM

## 2022-01-23 DIAGNOSIS — M546 Pain in thoracic spine: Secondary | ICD-10-CM

## 2022-06-06 ENCOUNTER — Encounter: Payer: 59 | Admitting: Adult Health

## 2022-09-28 ENCOUNTER — Encounter: Payer: Self-pay | Admitting: Emergency Medicine

## 2022-09-28 ENCOUNTER — Ambulatory Visit
Admission: EM | Admit: 2022-09-28 | Discharge: 2022-09-28 | Disposition: A | Discharge status: Home / Self Care | Payer: Managed Care, Other (non HMO)

## 2022-09-28 DIAGNOSIS — L03312 Cellulitis of back [any part except buttock]: Secondary | ICD-10-CM | POA: Diagnosis not present

## 2022-09-28 HISTORY — DX: Anxiety disorder, unspecified: F41.9

## 2022-09-28 MED ORDER — AMOXICILLIN-POT CLAVULANATE 875-125 MG PO TABS: 1.0000 | ORAL_TABLET | Freq: Two times a day (BID) | ORAL | 0 refills | Status: AC

## 2022-09-28 NOTE — ED Provider Notes (Signed)
[facility]    CSN: [id number] Arrival date & time: 09/28/22  1217      History   Chief Complaint No chief complaint on file.   HPI Jenna Luna is a 42 y.o. female.   HPI  She is complaining of one day of right sided ear pain with swelling in her neck, nasal  congestion, sore throatand cough,. Denies fever, chills, sneezing, runny nose, cough, new loss of smell or taste, shortness of breath, chest pain, nausea, or diarrhea. She reports that she had a boil that burst on yesterday. She reports that if feels better and she was encouraged to just allow it to drain.  This has been going on for 1 days. Exposure positive/negative COVID/Influenza/Strep. The current treatment has been OTC decongestant, cough syrup, Dayquil, Nyquil, Mucinex D/DM     Past Medical History:  Diagnosis Date   Anxiety    Depression    Depression    Hypertension     There are no problems to display for this patient.   Past Surgical History:  Procedure Laterality Date   CHOLECYSTECTOMY      OB History   No obstetric history on file.      Home Medications    Prior to Admission medications   Medication Sig Start Date End Date Taking? Authorizing Provider  albuterol (VENTOLIN HFA) 108 (90 Base) MCG/ACT inhaler Inhale into the lungs every 6 (six) hours as needed for wheezing or shortness of breath.   Yes [provider]  amoxicillin-clavulanate (AUGMENTIN) 875-125 MG tablet Take 1 tablet by mouth every 12 (twelve) hours for 10 days. 09/28/22 10/08/22 Yes Vevelyn Francois, NP  cholecalciferol (VITAMIN D3) 25 MCG (1000 UNIT) tablet Take 1,000 Units by mouth daily.   Yes [provider]  lisinopril (ZESTRIL) 2.5 MG tablet Take 2.5 mg by mouth daily.   Yes [provider]  ALPRAZolam Duanne Moron) 1 MG tablet Take 1 mg by mouth at bedtime as needed for anxiety.    [provider]  propranolol (INDERAL) 20 MG tablet Take 20 mg by mouth daily.    [provider]  sertraline (ZOLOFT) 100 MG tablet Take 150 mg by mouth daily.    [provider]  sodium chloride (OCEAN) 0.65 % SOLN nasal spray Place 1 spray into both nostrils as needed for congestion. 03/20/15 01/02/21  Noe Gens, PA-C    Family History Family History  Problem Relation Age of Onset   Thyroid disease Mother    Hypertension Mother    Hypertension Father    Hyperlipidemia Father     Social History Social History   Tobacco Use   Smoking status: Heavy Smoker   Smokeless tobacco: Never  Vaping Use   Vaping Use: Never used  Substance Use Topics   Alcohol use: No   Drug use: Never     Allergies   Sulfa antibiotics   Review of Systems Review of Systems   Physical Exam Triage Vital Signs ED Triage Vitals  Enc Vitals Group     BP 09/28/22 1222 122/77     Pulse Rate 09/28/22 1222 98     Resp 09/28/22 1222 18     Temp 09/28/22 1222 98.4 F (36.9 C)     Temp Source 09/28/22 1222 Oral     SpO2 09/28/22 1222 93 %     Weight --      Height --      Head Circumference --  Peak Flow --      Pain Score 09/28/22 1224 5     Pain Loc --      Pain Edu? --      Excl. in [place]? --    No data found.  Updated Vital Signs BP 122/77 (BP Location: Right Arm)   Pulse 98   Temp 98.4 F (36.9 C) (Oral)   Resp 18   LMP 09/05/2022 (Exact Date)   SpO2 93%   Visual Acuity Right Eye Distance:   Left Eye Distance:   Bilateral Distance:    Right Eye Near:   Left Eye Near:    Bilateral Near:     Physical Exam Constitutional:      Appearance: She is obese.  HENT:     Head: Normocephalic and atraumatic.     Right Ear: Tympanic membrane normal. There is no impacted cerumen.     Left Ear: Tympanic membrane normal. There is no impacted cerumen.     Nose: No congestion or rhinorrhea.     Mouth/Throat:     Mouth: Mucous membranes are moist.  Eyes:     Pupils: Pupils are equal, round, and reactive to light.  Cardiovascular:     Rate and Rhythm:  Normal rate and regular rhythm.     Pulses: Normal pulses.     Heart sounds: Normal heart sounds.  Pulmonary:     Effort: Pulmonary effort is normal.     Comments: Diminished  Musculoskeletal:        General: Normal range of motion.     Cervical back: Normal range of motion.  Skin:    General: Skin is warm and dry.     Capillary Refill: Capillary refill takes less than 2 seconds.  Neurological:     General: No focal deficit present.     Mental Status: She is alert and oriented to person, place, and time.  Psychiatric:        Mood and Affect: Mood normal.        Behavior: Behavior normal.      UC Treatments / Results  Labs (all labs ordered are listed, but only abnormal results are displayed) Labs Reviewed - No data to display  EKG   Radiology No results found.  Procedures Procedures (including critical care time)  Medications Ordered in UC Medications - No data to display  Initial Impression / Assessment and Plan / UC Course  I have reviewed the triage vital signs and the nursing notes.  Pertinent labs & imaging results that were available during my care of the patient were reviewed by me and considered in my medical decision making (see chart for details).     Ear pain Final Clinical Impressions(s) / UC Diagnoses   Final diagnoses:  Cellulitis of back except buttock     Discharge Instructions      You have cellulitis to left upper back. You have been prescribed Augmentin 875 mg bid for 10 days. This will help with any possible ear infection. Please completed the entire prescription. Follow up with PCP if the site is not improving after completion of treatment.       ED Prescriptions     Medication Sig Dispense Auth. Provider   amoxicillin-clavulanate (AUGMENTIN) 875-125 MG tablet Take 1 tablet by mouth every 12 (twelve) hours for 10 days. 20 tablet Vevelyn Francois, NP      PDMP not reviewed this encounter.   Dionisio David [place], NP 09/28/22  1314

## 2022-09-28 NOTE — ED Triage Notes (Signed)
Bilateral ear pain.  Right side hurts worse, pain going down to neck.  Sore throat, nasal congestion with bloody drainage from nose.

## 2022-09-28 NOTE — Discharge Instructions (Addendum)
You have cellulitis to left upper back. You have been prescribed Augmentin 875 mg bid for 10 days. This will help with any possible ear infection. Please completed the entire prescription. Follow up with PCP if the site is not improving after completion of treatment.

## 2022-10-12 ENCOUNTER — Ambulatory Visit
Admission: EM | Admit: 2022-10-12 | Discharge: 2022-10-12 | Disposition: A | Discharge status: Home / Self Care | Payer: Managed Care, Other (non HMO) | Attending: Family Medicine | Admitting: Family Medicine

## 2022-10-12 DIAGNOSIS — J069 Acute upper respiratory infection, unspecified: Secondary | ICD-10-CM | POA: Diagnosis present

## 2022-10-12 DIAGNOSIS — R059 Cough, unspecified: Secondary | ICD-10-CM | POA: Insufficient documentation

## 2022-10-12 DIAGNOSIS — Z1152 Encounter for screening for COVID-19: Secondary | ICD-10-CM | POA: Insufficient documentation

## 2022-10-12 MED ORDER — FLUTICASONE PROPIONATE 50 MCG/ACT NA SUSP: 1.0000 | Freq: Two times a day (BID) | NASAL | 2 refills | Status: DC

## 2022-10-12 MED ORDER — PROMETHAZINE-DM 6.25-15 MG/5ML PO SYRP: 5.0000 mL | ORAL_SOLUTION | Freq: Four times a day (QID) | ORAL | 0 refills | Status: AC | PRN

## 2022-10-12 NOTE — ED Provider Notes (Signed)
[facility]    CSN: [id number] Arrival date & time: 10/12/22  1302      History   Chief Complaint No chief complaint on file.   HPI Jenna Luna is a 42 y.o. female.   Patient presenting today with 1 day history of productive cough, sore throat, nasal congestion, fatigue.  Denies fever, chills, body aches, chest pain, shortness of breath, abdominal pain, nausea vomiting or diarrhea.  No known history of chronic pulmonary disease.  Taking over-the-counter cough and congestion medication with minimal relief.    Past Medical History:  Diagnosis Date   Anxiety    Depression    Depression    Hypertension     There are no problems to display for this patient.   Past Surgical History:  Procedure Laterality Date   CHOLECYSTECTOMY      OB History   No obstetric history on file.      Home Medications    Prior to Admission medications   Medication Sig Start Date End Date Taking? Authorizing Provider  fluticasone (FLONASE) 50 MCG/ACT nasal spray Place 1 spray into both nostrils 2 (two) times daily. 10/12/22  Yes Volney American, PA-C  promethazine-dextromethorphan (PROMETHAZINE-DM) 6.25-15 MG/5ML syrup Take 5 mLs by mouth 4 (four) times daily as needed. 10/12/22  Yes Volney American, PA-C  albuterol (VENTOLIN HFA) 108 (90 Base) MCG/ACT inhaler Inhale into the lungs every 6 (six) hours as needed for wheezing or shortness of breath.    [provider]  ALPRAZolam Duanne Moron) 1 MG tablet Take 1 mg by mouth at bedtime as needed for anxiety.    [provider]  cholecalciferol (VITAMIN D3) 25 MCG (1000 UNIT) tablet Take 1,000 Units by mouth daily.    [provider]  lisinopril (ZESTRIL) 2.5 MG tablet Take 2.5 mg by mouth daily.    [provider]  propranolol (INDERAL) 20 MG tablet Take 20 mg by mouth daily.    [provider]  sertraline (ZOLOFT) 100 MG tablet Take 150 mg by mouth daily.    [provider]  sodium chloride (OCEAN) 0.65 % SOLN nasal spray Place 1 spray into both nostrils as needed for congestion. 03/20/15 01/02/21  Noe Gens, PA-C    Family History Family History  Problem Relation Age of Onset   Thyroid disease Mother    Hypertension Mother    Hypertension Father    Hyperlipidemia Father     Social History Social History   Tobacco Use   Smoking status: Heavy Smoker   Smokeless tobacco: Never  Vaping Use   Vaping Use: Never used  Substance Use Topics   Alcohol use: No   Drug use: Never     Allergies   Codeine and Sulfa antibiotics   Review of Systems Review of Systems Per HPI  Physical Exam Triage Vital Signs ED Triage Vitals  Enc Vitals Group     BP 10/12/22 1313 112/73     Pulse Rate 10/12/22 1313 77     Resp 10/12/22 1313 18     Temp 10/12/22 1313 99.1 F (37.3 C)     Temp Source 10/12/22 1313 Oral     SpO2 10/12/22 1313 95 %     Weight --      Height --      Head Circumference --      Peak Flow --      Pain Score 10/12/22 1315 6     Pain Loc --  Pain Edu? --      Excl. in [place]? --    No data found.  Updated Vital Signs BP 112/73 (BP Location: Right Arm)   Pulse 77   Temp 99.1 F (37.3 C) (Oral)   Resp 18   LMP 09/26/2022 (Exact Date)   SpO2 95%   Visual Acuity Right Eye Distance:   Left Eye Distance:   Bilateral Distance:    Right Eye Near:   Left Eye Near:    Bilateral Near:     Physical Exam Vitals and nursing note reviewed.  Constitutional:      Appearance: Normal appearance.  HENT:     Head: Atraumatic.     Right Ear: Tympanic membrane and external ear normal.     Left Ear: Tympanic membrane and external ear normal.     Nose: Rhinorrhea present.     Mouth/Throat:     Mouth: Mucous membranes are moist.     Pharynx: Posterior oropharyngeal erythema present.  Eyes:     Extraocular Movements: Extraocular movements intact.     Conjunctiva/sclera: Conjunctivae normal.  Cardiovascular:     Rate  and Rhythm: Normal rate and regular rhythm.     Heart sounds: Normal heart sounds.  Pulmonary:     Effort: Pulmonary effort is normal.     Breath sounds: Normal breath sounds. No wheezing or rales.  Musculoskeletal:        General: Normal range of motion.     Cervical back: Normal range of motion and neck supple.  Skin:    General: Skin is warm and dry.  Neurological:     Mental Status: She is alert and oriented to person, place, and time.  Psychiatric:        Mood and Affect: Mood normal.        Thought Content: Thought content normal.      UC Treatments / Results  Labs (all labs ordered are listed, but only abnormal results are displayed) Labs Reviewed  SARS CORONAVIRUS 2 (TAT 6-24 HRS)    EKG   Radiology No results found.  Procedures Procedures (including critical care time)  Medications Ordered in UC Medications - No data to display  Initial Impression / Assessment and Plan / UC Course  I have reviewed the triage vital signs and the nursing notes.  Pertinent labs & imaging results that were available during my care of the patient were reviewed by me and considered in my medical decision making (see chart for details).     Vitals and exam overall reassuring and suggestive of a viral respiratory infection.  COVID testing pending, treat with Phenergan DM, Flonase, supportive over-the-counter medications and home care.  Return for worsening symptoms.  Final Clinical Impressions(s) / UC Diagnoses   Final diagnoses:  Viral URI with cough   Discharge Instructions   None    ED Prescriptions     Medication Sig Dispense Auth. Provider   promethazine-dextromethorphan (PROMETHAZINE-DM) 6.25-15 MG/5ML syrup Take 5 mLs by mouth 4 (four) times daily as needed. 100 mL Volney American, PA-C   fluticasone [facility]) 50 MCG/ACT nasal spray Place 1 spray into both nostrils 2 (two) times daily. 16 g Volney American, [place]      PDMP not reviewed this  encounter.   Volney American, [place] 10/12/22 1338

## 2022-10-12 NOTE — ED Triage Notes (Signed)
Pt reports excessive coughing with some mucus, throat pain (can taste infection") has yellow mucus from her throat, and chest pain due to coughing x 1 day

## 2022-10-13 LAB — SARS CORONAVIRUS 2 (TAT 6-24 HRS): SARS Coronavirus 2: NEGATIVE

## 2022-10-15 ENCOUNTER — Telehealth: Payer: Managed Care, Other (non HMO) | Admitting: Nurse Practitioner

## 2022-10-15 DIAGNOSIS — J020 Streptococcal pharyngitis: Secondary | ICD-10-CM

## 2022-10-15 MED ORDER — AMOXICILLIN 500 MG PO CAPS: 500.0000 mg | ORAL_CAPSULE | Freq: Two times a day (BID) | ORAL | 0 refills | Status: AC

## 2022-10-15 NOTE — Progress Notes (Signed)
Virtual Visit Consent   Jenna Luna, you are scheduled for a virtual visit with a [facility] provider today. Just as with appointments in the office, your consent must be obtained to participate. Your consent will be active for this visit and any virtual visit you may have with one of our providers in the next 365 days. If you have a MyChart account, a copy of this consent can be sent to you electronically.  As this is a virtual visit, video technology does not allow for your provider to perform a traditional examination. This may limit your provider's ability to fully assess your condition. If your provider identifies any concerns that need to be evaluated in person or the need to arrange testing (such as labs, EKG, etc.), we will make arrangements to do so. Although advances in technology are sophisticated, we cannot ensure that it will always work on either your end or our end. If the connection with a video visit is poor, the visit may have to be switched to a telephone visit. With either a video or telephone visit, we are not always able to ensure that we have a secure connection.  By engaging in this virtual visit, you consent to the provision of healthcare and authorize for your insurance to be billed (if applicable) for the services provided during this visit. Depending on your insurance coverage, you may receive a charge related to this service.  I need to obtain your verbal consent now. Are you willing to proceed with your visit today? Jenna Luna has provided verbal consent on 10/15/2022 for a virtual visit (video or telephone). Gildardo Pounds, NP  Date: 10/15/2022 6:42 PM  Virtual Visit via Video Note   I, Gildardo Pounds, connected with  Jenna Luna  ([id number], 1981-03-19) on 10/15/22 at  6:45 PM EST by a video-enabled telemedicine application and verified that I am speaking with the correct person using two identifiers.  Location: Patient: Virtual Visit Location  Patient: Home Provider: Virtual Visit Location Provider: Home Office   I discussed the limitations of evaluation and management by telemedicine and the availability of in person appointments. The patient expressed understanding and agreed to proceed.    History of Present Illness: Jenna Luna is a 42 y.o. who identifies as a female who was assigned female at birth, and is being seen today for strep pharyngitis.  Sore Throat: Patient complains of sore throat. Associated symptoms include suspected fevers but not measured at home, chest congestion, chills, dry cough, enlarged tonsils, nasal blockage, pain while swallowing, post nasal drip, sinus and nasal congestion, sneezing, sore throat, swollen glands, and white spots in throat.Onset of symptoms was several days ago, gradually worsening since that time. She is drinking plenty of fluids. She has not had recent close exposure to someone with proven streptococcal pharyngitis.  Problems: There are no problems to display for this patient.   Allergies:  Allergies  Allergen Reactions   Codeine Other (See Comments)    Can not do tylenol with Codeine. Get sweat felling and jitters. Any other Codiene can tolerate   Sulfa Antibiotics    Medications:  Current Outpatient Medications:    amoxicillin (AMOXIL) 500 MG capsule, Take 1 capsule (500 mg total) by mouth 2 (two) times daily for 10 days., Disp: 20 capsule, Rfl: 0   albuterol (VENTOLIN HFA) 108 (90 Base) MCG/ACT inhaler, Inhale into the lungs every 6 (six) hours as needed for wheezing or shortness of breath., Disp: , Rfl:  ALPRAZolam (XANAX) 1 MG tablet, Take 1 mg by mouth at bedtime as needed for anxiety., Disp: , Rfl:    cholecalciferol (VITAMIN D3) 25 MCG (1000 UNIT) tablet, Take 1,000 Units by mouth daily., Disp: , Rfl:    fluticasone (FLONASE) 50 MCG/ACT nasal spray, Place 1 spray into both nostrils 2 (two) times daily., Disp: 16 g, Rfl: 2   lisinopril (ZESTRIL) 2.5 MG tablet, Take 2.5  mg by mouth daily., Disp: , Rfl:    promethazine-dextromethorphan (PROMETHAZINE-DM) 6.25-15 MG/5ML syrup, Take 5 mLs by mouth 4 (four) times daily as needed., Disp: 100 mL, Rfl: 0   propranolol (INDERAL) 20 MG tablet, Take 20 mg by mouth daily., Disp: , Rfl:    sertraline (ZOLOFT) 100 MG tablet, Take 150 mg by mouth daily., Disp: , Rfl:   Observations/Objective: Patient is well-developed, well-nourished in no acute distress.  Resting comfortably at home.  Head is normocephalic, atraumatic.  No labored breathing.  Speech is clear and coherent with logical content.  Patient is alert and oriented at baseline.    Assessment and Plan: 1. Strep pharyngitis - amoxicillin (AMOXIL) 500 MG capsule; Take 1 capsule (500 mg total) by mouth 2 (two) times daily for 10 days.  Dispense: 20 capsule; Refill: 0 May alternate with Tylenol and Motrin for fever and pain relief   Follow Up Instructions: I discussed the assessment and treatment plan with the patient. The patient was provided an opportunity to ask questions and all were answered. The patient agreed with the plan and demonstrated an understanding of the instructions.  A copy of instructions were sent to the patient via MyChart unless otherwise noted below.    The patient was advised to call back or seek an in-person evaluation if the symptoms worsen or if the condition fails to improve as anticipated.  Time:  I spent 11 minutes with the patient via telehealth technology discussing the above problems/concerns.    Gildardo Pounds, NP

## 2022-10-15 NOTE — Patient Instructions (Signed)
  Jenna Luna, thank you for joining Gildardo Pounds, NP for today's virtual visit.  While this provider is not your primary care provider (PCP), if your PCP is located in our provider database this encounter information will be shared with them immediately following your visit.   [facility] account gives you access to today's visit and all your visits, tests, and labs performed at [facility] " click here if you don't have a [facility] account or go to [email or web address]  Consent: (Patient) Jenna Luna provided verbal consent for this virtual visit at the beginning of the encounter.  Current Medications:  Current Outpatient Medications:    amoxicillin (AMOXIL) 500 MG capsule, Take 1 capsule (500 mg total) by mouth 2 (two) times daily for 10 days., Disp: 20 capsule, Rfl: 0   albuterol (VENTOLIN HFA) 108 (90 Base) MCG/ACT inhaler, Inhale into the lungs every 6 (six) hours as needed for wheezing or shortness of breath., Disp: , Rfl:    ALPRAZolam (XANAX) 1 MG tablet, Take 1 mg by mouth at bedtime as needed for anxiety., Disp: , Rfl:    cholecalciferol (VITAMIN D3) 25 MCG (1000 UNIT) tablet, Take 1,000 Units by mouth daily., Disp: , Rfl:    fluticasone (FLONASE) 50 MCG/ACT nasal spray, Place 1 spray into both nostrils 2 (two) times daily., Disp: 16 g, Rfl: 2   lisinopril (ZESTRIL) 2.5 MG tablet, Take 2.5 mg by mouth daily., Disp: , Rfl:    promethazine-dextromethorphan (PROMETHAZINE-DM) 6.25-15 MG/5ML syrup, Take 5 mLs by mouth 4 (four) times daily as needed., Disp: 100 mL, Rfl: 0   propranolol (INDERAL) 20 MG tablet, Take 20 mg by mouth daily., Disp: , Rfl:    sertraline (ZOLOFT) 100 MG tablet, Take 150 mg by mouth daily., Disp: , Rfl:    Medications ordered in this encounter:  Meds ordered this encounter  Medications   amoxicillin (AMOXIL) 500 MG capsule    Sig: Take 1 capsule (500 mg total) by mouth 2 (two) times daily for 10 days.     Dispense:  20 capsule    Refill:  0    Order Specific Question:   Supervising Provider    Answer:   Chase Picket [id number]     *If you need refills on other medications prior to your next appointment, please contact your pharmacy*  Follow-Up: Call back or seek an in-person evaluation if the symptoms worsen or if the condition fails to improve as anticipated.  [place] [phone]  Other Instructions May alternate with Tylenol and Motrin for fever and pain relief   If you have been instructed to have an in-person evaluation today at a local Urgent Care facility, please use the link below. It will take you to a list of all of our available [facility], including address, phone number and hours of operation. Please do not delay care.  [facility]  If you or a family member do not have a primary care provider, use the link below to schedule a visit and establish care. When you choose a [facility] primary care physician or advanced practice provider, you gain a long-term partner in health. Find a Primary Care Provider  Learn more about [place]'s in-office and virtual care options: [facility]

## 2022-11-12 ENCOUNTER — Ambulatory Visit
Admission: EM | Admit: 2022-11-12 | Discharge: 2022-11-12 | Disposition: A | Discharge status: Home / Self Care | Payer: Managed Care, Other (non HMO) | Attending: Nurse Practitioner | Admitting: Nurse Practitioner

## 2022-11-12 DIAGNOSIS — J069 Acute upper respiratory infection, unspecified: Secondary | ICD-10-CM | POA: Insufficient documentation

## 2022-11-12 DIAGNOSIS — R509 Fever, unspecified: Secondary | ICD-10-CM

## 2022-11-12 DIAGNOSIS — Z1152 Encounter for screening for COVID-19: Secondary | ICD-10-CM | POA: Insufficient documentation

## 2022-11-12 LAB — POCT URINALYSIS DIP (MANUAL ENTRY)
Bilirubin, UA: NEGATIVE
Blood, UA: NEGATIVE
Glucose, UA: NEGATIVE mg/dL
Ketones, POC UA: NEGATIVE mg/dL
Leukocytes, UA: NEGATIVE
Nitrite, UA: NEGATIVE
Spec Grav, UA: >=1.03 — AB (ref 1.010–1.025)
Urobilinogen, UA: 1 E.U./dL
pH, UA: 6 (ref 5.0–8.0)

## 2022-11-12 LAB — POCT INFLUENZA A/B
Influenza A, POC: NEGATIVE
Influenza B, POC: NEGATIVE

## 2022-11-12 MED ORDER — ACETAMINOPHEN 500 MG PO TABS
1000.0000 mg | ORAL_TABLET | Freq: Once | ORAL | Status: AC
  Administered 2022-11-12: 1000 mg via ORAL

## 2022-11-12 MED ORDER — PSEUDOEPH-BROMPHEN-DM 30-2-10 MG/5ML PO SYRP: 5.0000 mL | ORAL_SOLUTION | Freq: Four times a day (QID) | ORAL | 0 refills | Status: DC | PRN

## 2022-11-12 MED ORDER — FLUTICASONE PROPIONATE 50 MCG/ACT NA SUSP: 2.0000 | Freq: Every day | NASAL | 0 refills | Status: AC

## 2022-11-12 NOTE — ED Provider Notes (Signed)
[facility]    CSN: [id number] Arrival date & time: 11/12/22  1725      History   Chief Complaint Chief Complaint  Patient presents with   Fever   Otalgia         HPI Jenna Luna is a 42 y.o. female.   The history is provided by the patient.   The patient presents for complaints of difficulty urinating, low back pain, fever, and left ear pain that started earlier today.  Patient also complains of a cough that is productive of thick white sputum.  She denies headache, sore throat, ear drainage, wheezing, shortness of breath, difficulty breathing, or GI symptoms.  With regard to her back, she denies injury or trauma, urinary frequency, urgency, hesitancy, or hematuria.  She does state that she is having difficulty urinating at this time.  She states that she is drinking plenty of water.  She has not taken any medication for her symptoms.  Past Medical History:  Diagnosis Date   Anxiety    Depression    Depression    Hypertension     There are no problems to display for this patient.   Past Surgical History:  Procedure Laterality Date   CHOLECYSTECTOMY      OB History   No obstetric history on file.      Home Medications    Prior to Admission medications   Medication Sig Start Date End Date Taking? Authorizing Provider  brompheniramine-pseudoephedrine-DM 30-2-10 MG/5ML syrup Take 5 mLs by mouth 4 (four) times daily as needed. 11/12/22  Yes Rolinda Impson-Warren, Alda Lea, NP  fluticasone (FLONASE) 50 MCG/ACT nasal spray Place 2 sprays into both nostrils daily. 11/12/22  Yes Arcadio Cope-Warren, Alda Lea, NP  albuterol (VENTOLIN HFA) 108 (90 Base) MCG/ACT inhaler Inhale into the lungs every 6 (six) hours as needed for wheezing or shortness of breath.    [provider]  ALPRAZolam Duanne Moron) 1 MG tablet Take 1 mg by mouth at bedtime as needed for anxiety.    [provider]  cholecalciferol (VITAMIN D3) 25 MCG (1000 UNIT) tablet Take 1,000 Units  by mouth daily.    [provider]  lisinopril (ZESTRIL) 2.5 MG tablet Take 2.5 mg by mouth daily.    [provider]  promethazine-dextromethorphan (PROMETHAZINE-DM) 6.25-15 MG/5ML syrup Take 5 mLs by mouth 4 (four) times daily as needed. 10/12/22   Volney American, PA-C  propranolol (INDERAL) 20 MG tablet Take 20 mg by mouth daily.    [provider]  sertraline (ZOLOFT) 100 MG tablet Take 150 mg by mouth daily.    [provider]  sodium chloride (OCEAN) 0.65 % SOLN nasal spray Place 1 spray into both nostrils as needed for congestion. 03/20/15 01/02/21  Noe Gens, PA-C    Family History Family History  Problem Relation Age of Onset   Thyroid disease Mother    Hypertension Mother    Hypertension Father    Hyperlipidemia Father     Social History Social History   Tobacco Use   Smoking status: Heavy Smoker   Smokeless tobacco: Never  Vaping Use   Vaping Use: Never used  Substance Use Topics   Alcohol use: No   Drug use: Never     Allergies   Codeine and Sulfa antibiotics   Review of Systems Review of Systems Per HPI  Physical Exam Triage Vital Signs ED Triage Vitals  Enc Vitals Group     BP 11/12/22 1734 123/84  Pulse Rate 11/12/22 1734 (!) 119     Resp 11/12/22 1734 20     Temp 11/12/22 1734 (!) 100.4 F (38 C)     Temp Source 11/12/22 1734 Oral     SpO2 11/12/22 1734 94 %     Weight --      Height --      Head Circumference --      Peak Flow --      Pain Score 11/12/22 1738 6     Pain Loc --      Pain Edu? --      Excl. in [place]? --    No data found.  Updated Vital Signs BP 123/84 (BP Location: Right Wrist)   Pulse (!) 119   Temp (!) 100.4 F (38 C) (Oral)   Resp 20   SpO2 94%   Visual Acuity Right Eye Distance:   Left Eye Distance:   Bilateral Distance:    Right Eye Near:   Left Eye Near:    Bilateral Near:     Physical Exam Vitals and nursing note reviewed.  Constitutional:      General:  She is not in acute distress.    Appearance: Normal appearance.  HENT:     Head: Normocephalic.     Right Ear: Tympanic membrane, ear canal and external ear normal.     Left Ear: Tympanic membrane, ear canal and external ear normal.     Nose: Congestion present. No rhinorrhea.     Mouth/Throat:     Mouth: Mucous membranes are moist.     Pharynx: Posterior oropharyngeal erythema present.     Comments: Cobblestoning present on posterior oropharynx Eyes:     Extraocular Movements: Extraocular movements intact.     Conjunctiva/sclera: Conjunctivae normal.     Pupils: Pupils are equal, round, and reactive to light.  Cardiovascular:     Rate and Rhythm: Regular rhythm. Tachycardia present.     Pulses: Normal pulses.     Heart sounds: Normal heart sounds.  Pulmonary:     Effort: Pulmonary effort is normal. No respiratory distress.     Breath sounds: Normal breath sounds. No stridor. No wheezing, rhonchi or rales.  Abdominal:     General: Bowel sounds are normal.     Palpations: Abdomen is soft.     Tenderness: There is no abdominal tenderness.  Musculoskeletal:     Cervical back: Normal range of motion.  Lymphadenopathy:     Cervical: No cervical adenopathy.  Skin:    General: Skin is warm and dry.  Neurological:     General: No focal deficit present.     Mental Status: She is alert and oriented to person, place, and time.  Psychiatric:        Mood and Affect: Mood normal.        Behavior: Behavior normal.      UC Treatments / Results  Labs (all labs ordered are listed, but only abnormal results are displayed) Labs Reviewed  POCT URINALYSIS DIP (MANUAL ENTRY) - Abnormal; Notable for the following components:      Result Value   Spec Grav, UA >=1.030 (*)    Protein Ur, POC trace (*)    All other components within normal limits  SARS CORONAVIRUS 2 (TAT 6-24 HRS)  URINE CULTURE  POCT INFLUENZA A/B    EKG   Radiology No results found.  Procedures Procedures  (including critical care time)  Medications Ordered in UC Medications  acetaminophen (TYLENOL) tablet 1,000  mg (1,000 mg Oral Given 11/12/22 1739)    Initial Impression / Assessment and Plan / UC Course  I have reviewed the triage vital signs and the nursing notes.  Pertinent labs & imaging results that were available during my care of the patient were reviewed by me and considered in my medical decision making (see chart for details).  The patient is well-appearing, she is in no acute distress, she is tachycardic and febrile at this time.  Patient was administered Tylenol at this time.  The rapid strep test and flu test were negative.  Urinalysis was also negative for infection.  A COVID test is pending.  Patient is a candidate to receive Paxlovid if her COVID test is positive.  Suspect a viral upper respiratory infection with cough given the patient's onset of symptoms.  With regard to her low back pain, no obvious UTI seen on her urinalysis, urine culture is pending.  Symptomatic treatment was provided with Bromfed-DM for her cough, and fluticasone 50 mcg nasal spray to help with her nasal congestion and runny nose.  Supportive care recommendations were provided to the patient along with indications of when follow-up may be necessary.  Patient is in agreement with this plan of care and verbalizes understanding.  All questions were answered.  Patient stable for discharge.  Final Clinical Impressions(s) / UC Diagnoses   Final diagnoses:  Encounter for screening for COVID-19  Viral upper respiratory tract infection with cough  Fever, unspecified     Discharge Instructions      The rapid strep test and flu test are negative.  Your urinalysis is also negative for a urinary tract infection.  A urine culture and throat culture have been ordered. A COVID test is also pending.  If your COVID test is positive, you will be contacted.  If you choose, you are a candidate to receive Paxlovid as  antiviral therapy if your COVID test is positive. Take medication as prescribed. Increase fluids and allow for plenty of rest.  Recommend that you drink at least 8-10 8 ounce glasses of water daily for the next several days. Recommend over-the-counter Tylenol as needed for pain, fever, or general discomfort. Recommend use of a humidifier in your bedroom at nighttime during sleep and sleeping elevated on pillows while cough symptoms persist. Also use throat lozenges or cough drops to help decrease your cough symptoms. Please be advised that a viral illness can last from 7 to 14 days.  If your symptoms suddenly worsen before that time, or extend beyond that time, please follow-up in this clinic or with your primary care physician for further evaluation. Follow-up as needed.     ED Prescriptions     Medication Sig Dispense Auth. Provider   brompheniramine-pseudoephedrine-DM 30-2-10 MG/5ML syrup Take 5 mLs by mouth 4 (four) times daily as needed. 140 mL Vonceil Upshur-Warren, Alda Lea, NP   fluticasone (FLONASE) 50 MCG/ACT nasal spray Place 2 sprays into both nostrils daily. 16 g Kenshin Splawn-Warren, Alda Lea, NP      PDMP not reviewed this encounter.   Tish Men, NP 11/12/22 1842

## 2022-11-12 NOTE — Discharge Instructions (Addendum)
The rapid strep test and flu test are negative.  Your urinalysis is also negative for a urinary tract infection.  A urine culture and throat culture have been ordered. A COVID test is also pending.  If your COVID test is positive, you will be contacted.  If you choose, you are a candidate to receive Paxlovid as antiviral therapy if your COVID test is positive. Take medication as prescribed. Increase fluids and allow for plenty of rest.  Recommend that you drink at least 8-10 8 ounce glasses of water daily for the next several days. Recommend over-the-counter Tylenol as needed for pain, fever, or general discomfort. Recommend use of a humidifier in your bedroom at nighttime during sleep and sleeping elevated on pillows while cough symptoms persist. Also use throat lozenges or cough drops to help decrease your cough symptoms. Please be advised that a viral illness can last from 7 to 14 days.  If your symptoms suddenly worsen before that time, or extend beyond that time, please follow-up in this clinic or with your primary care physician for further evaluation. Follow-up as needed.

## 2022-11-12 NOTE — ED Triage Notes (Signed)
Pt reports difficulty urinating, low back pain, fever and left ear pain started this morning.

## 2022-11-13 LAB — SARS CORONAVIRUS 2 (TAT 6-24 HRS): SARS Coronavirus 2: NEGATIVE

## 2022-11-14 LAB — URINE CULTURE

## 2023-06-16 ENCOUNTER — Ambulatory Visit
Admission: RE | Admit: 2023-06-16 | Discharge: 2023-06-16 | Disposition: A | Discharge status: Home / Self Care | Payer: Medicaid Other | Source: Ambulatory Visit | Attending: Internal Medicine | Admitting: Internal Medicine

## 2023-06-16 VITALS — BP 134/92 | HR 78 | Temp 98.5°F | Resp 18

## 2023-06-16 DIAGNOSIS — L02413 Cutaneous abscess of right upper limb: Secondary | ICD-10-CM

## 2023-06-16 MED ORDER — DOXYCYCLINE HYCLATE 100 MG PO CAPS: 100.0000 mg | ORAL_CAPSULE | Freq: Two times a day (BID) | ORAL | 0 refills | Status: AC

## 2023-06-16 NOTE — ED Triage Notes (Signed)
Pt reports she has an abscess on her right shoulder that has been there for a month but over the last 2 weeks the pain has increased.

## 2023-06-16 NOTE — Discharge Instructions (Signed)
We drained your abscess today in the clinic and left open to continue to drain.  Continue to use warm compresses to the wound to further encourage drainage from the wound.  You may do this in the shower as well with gentle compresses to the area to allow more infected material to drain.   Take antibiotic as prescribed to treat infection to the abscess.   Change your dressings frequently as the wound heals.   You may take over the counter pain medicines as needed for pain once the numbing wears off.  If you notice any worsening signs of infection such as redness, swelling, fever, or worsening drainage, please return to urgent care for reevaluation.

## 2023-06-16 NOTE — ED Provider Notes (Signed)
[facility]    CSN: [id number] Arrival date & time: 06/16/23  1332      History   Chief Complaint Chief Complaint  Patient presents with   Abscess    Entered by patient    HPI Jenna Luna is a 42 y.o. female.   Patient presents to urgent care for evaluation of abscess to the right shoulder that she first noticed as a cyst 2 months ago but the area has become more swollen, red, and tender over the last 2 to 3 days.  She has not noted any drainage from the area.  No recent trauma/injury to the area causing tenderness.  Denies recent fevers, chills, body aches, or numbness/tingling to the right hand.  She has not taken any recent antibiotics and denies history of immunosuppression.     Past Medical History:  Diagnosis Date   Anxiety    Depression    Depression    Hypertension     There are no problems to display for this patient.   Past Surgical History:  Procedure Laterality Date   CHOLECYSTECTOMY      OB History   No obstetric history on file.      Home Medications    Prior to Admission medications   Medication Sig Start Date End Date Taking? Authorizing Provider  doxycycline (VIBRAMYCIN) 100 MG capsule Take 1 capsule (100 mg total) by mouth 2 (two) times daily for 7 days. 06/16/23 06/23/23 Yes Carlisle Beers, FNP  albuterol (VENTOLIN HFA) 108 (90 Base) MCG/ACT inhaler Inhale into the lungs every 6 (six) hours as needed for wheezing or shortness of breath.    [provider]  ALPRAZolam Prudy Feeler) 1 MG tablet Take 1 mg by mouth at bedtime as needed for anxiety.    [provider]  brompheniramine-pseudoephedrine-DM 30-2-10 MG/5ML syrup Take 5 mLs by mouth 4 (four) times daily as needed. 11/12/22   Leath-Warren, Sadie Haber, NP  cholecalciferol (VITAMIN D3) 25 MCG (1000 UNIT) tablet Take 1,000 Units by mouth daily.    [provider]  fluticasone (FLONASE) 50 MCG/ACT nasal spray Place 2 sprays into both nostrils  daily. 11/12/22   Leath-Warren, Sadie Haber, NP  lisinopril (ZESTRIL) 2.5 MG tablet Take 2.5 mg by mouth daily.    [provider]  promethazine-dextromethorphan (PROMETHAZINE-DM) 6.25-15 MG/5ML syrup Take 5 mLs by mouth 4 (four) times daily as needed. 10/12/22   Particia Nearing, PA-C  propranolol (INDERAL) 20 MG tablet Take 20 mg by mouth daily.    [provider]  sertraline (ZOLOFT) 100 MG tablet Take 150 mg by mouth daily.    [provider]  sodium chloride (OCEAN) 0.65 % SOLN nasal spray Place 1 spray into both nostrils as needed for congestion. 03/20/15 01/02/21  Lurene Shadow, PA-C    Family History Family History  Problem Relation Age of Onset   Thyroid disease Mother    Hypertension Mother    Hypertension Father    Hyperlipidemia Father     Social History Social History   Tobacco Use   Smoking status: Heavy Smoker   Smokeless tobacco: Never  Vaping Use   Vaping status: Never Used  Substance Use Topics   Alcohol use: No   Drug use: Never     Allergies   Codeine and Sulfa antibiotics   Review of Systems Review of Systems Per HPI  Physical Exam Triage Vital Signs ED Triage Vitals [06/16/23 1356]  Encounter Vitals Group  BP (!) 134/92     Systolic BP Percentile      Diastolic BP Percentile      Pulse Rate 78     Resp 18     Temp 98.5 F (36.9 C)     Temp Source Oral     SpO2 95 %     Weight      Height      Head Circumference      Peak Flow      Pain Score 8     Pain Loc      Pain Education      Exclude from Growth Chart    No data found.  Updated Vital Signs BP (!) 134/92 (BP Location: Right Arm)   Pulse 78   Temp 98.5 F (36.9 C) (Oral)   Resp 18   LMP 06/14/2023 Comment: start date  SpO2 95%   Visual Acuity Right Eye Distance:   Left Eye Distance:   Bilateral Distance:    Right Eye Near:   Left Eye Near:    Bilateral Near:     Physical Exam Vitals and nursing note reviewed.  Constitutional:       Appearance: She is not ill-appearing or toxic-appearing.  HENT:     Head: Normocephalic and atraumatic.     Right Ear: Hearing and external ear normal.     Left Ear: Hearing and external ear normal.     Nose: Nose normal.     Mouth/Throat:     Lips: Pink.  Eyes:     General: Lids are normal. Vision grossly intact. Gaze aligned appropriately.     Extraocular Movements: Extraocular movements intact.     Conjunctiva/sclera: Conjunctivae normal.  Pulmonary:     Effort: Pulmonary effort is normal.  Musculoskeletal:     Cervical back: Neck supple.  Skin:    General: Skin is warm and dry.     Capillary Refill: Capillary refill takes less than 2 seconds.     Findings: No rash.          Comments: 2 to 3 cm in diameter abscess present to the right shoulder.  Abscess is fluctuant, erythematous, warm, and tender to palpation.  Normal ROM of the shoulder.  Neurological:     General: No focal deficit present.     Mental Status: She is alert and oriented to person, place, and time. Mental status is at baseline.     Cranial Nerves: No dysarthria or facial asymmetry.  Psychiatric:        Mood and Affect: Mood normal.        Speech: Speech normal.        Behavior: Behavior normal.        Thought Content: Thought content normal.        Judgment: Judgment normal.      UC Treatments / Results  Labs (all labs ordered are listed, but only abnormal results are displayed) Labs Reviewed - No data to display  EKG   Radiology No results found.  Procedures Incision and Drainage  Date/Time: 06/16/2023 2:51 PM  Performed by: Carlisle Beers, FNP Authorized by: Carlisle Beers, FNP   Consent:    Consent obtained:  Verbal   Consent given by:  Patient   Risks, benefits, and alternatives were discussed: yes     Risks discussed:  Bleeding, damage to other organs, infection, incomplete drainage and pain   Alternatives discussed:  No treatment Universal protocol:    Patient  identity  confirmed:  Verbally with patient Location:    Type:  Abscess   Size:  2.5cm   Location:  Upper extremity   Upper extremity location:  Shoulder   Shoulder location:  R shoulder Pre-procedure details:    Skin preparation:  Povidone-iodine and chlorhexidine Sedation:    Sedation type:  None Anesthesia:    Anesthesia method:  None Procedure type:    Complexity:  Complex Procedure details:    Incision types:  Stab incision   Incision depth:  Dermal   Wound management:  Probed and deloculated and irrigated with saline   Drainage:  Bloody and purulent   Drainage amount:  Moderate   Wound treatment:  Wound left open   Packing materials:  None Post-procedure details:    Procedure completion:  Tolerated well, no immediate complications  (including critical care time)  Medications Ordered in UC Medications - No data to display  Initial Impression / Assessment and Plan / UC Course  I have reviewed the triage vital signs and the nursing notes.  Pertinent labs & imaging results that were available during my care of the patient were reviewed by me and considered in my medical decision making (see chart for details).   1.  Abscess of right shoulder See incision and drainage note above for further detail regarding incision and drainage procedure, patient tolerated well.  Wound cleansed and dressed in clinic. Wound care discussed. Warm compresses recommended to allow wound to drain further. Doxycycline antibiotic as prescribed.  Tylenol may be used as needed for pain once numbing wears off.   Counseled patient on potential for adverse effects with medications prescribed/recommended today, strict ER and return-to-clinic precautions discussed, patient verbalized understanding.    Final Clinical Impressions(s) / UC Diagnoses   Final diagnoses:  Abscess of right shoulder     Discharge Instructions      We drained your abscess today in the clinic and left open to continue to  drain.  Continue to use warm compresses to the wound to further encourage drainage from the wound.  You may do this in the shower as well with gentle compresses to the area to allow more infected material to drain.   Take antibiotic as prescribed to treat infection to the abscess.   Change your dressings frequently as the wound heals.   You may take over the counter pain medicines as needed for pain once the numbing wears off.  If you notice any worsening signs of infection such as redness, swelling, fever, or worsening drainage, please return to urgent care for reevaluation.     ED Prescriptions     Medication Sig Dispense Auth. Provider   doxycycline (VIBRAMYCIN) 100 MG capsule Take 1 capsule (100 mg total) by mouth 2 (two) times daily for 7 days. 14 capsule Carlisle Beers, FNP      PDMP not reviewed this encounter.   Carlisle Beers, [place] 06/16/23 1452

## 2024-10-14 ENCOUNTER — Ambulatory Visit: Admitting: Advanced Practice Midwife

## 2024-10-14 ENCOUNTER — Other Ambulatory Visit (HOSPITAL_COMMUNITY)
Admission: RE | Admit: 2024-10-14 | Discharge: 2024-10-14 | Disposition: A | Source: Ambulatory Visit | Attending: Advanced Practice Midwife | Admitting: Advanced Practice Midwife

## 2024-10-14 ENCOUNTER — Encounter: Payer: Self-pay | Admitting: Advanced Practice Midwife

## 2024-10-14 VITALS — BP 116/75 | HR 85 | Ht 62.0 in | Wt 272.0 lb

## 2024-10-14 DIAGNOSIS — Z01419 Encounter for gynecological examination (general) (routine) without abnormal findings: Secondary | ICD-10-CM | POA: Insufficient documentation

## 2024-10-14 DIAGNOSIS — Z1331 Encounter for screening for depression: Secondary | ICD-10-CM | POA: Diagnosis not present

## 2024-10-14 DIAGNOSIS — N3000 Acute cystitis without hematuria: Secondary | ICD-10-CM

## 2024-10-14 DIAGNOSIS — Z1151 Encounter for screening for human papillomavirus (HPV): Secondary | ICD-10-CM

## 2024-10-14 DIAGNOSIS — R350 Frequency of micturition: Secondary | ICD-10-CM

## 2024-10-14 LAB — POCT URINALYSIS DIPSTICK OB
Blood, UA: NEGATIVE
Glucose, UA: NEGATIVE
Leukocytes, UA: NEGATIVE

## 2024-10-14 MED ORDER — NITROFURANTOIN MONOHYD MACRO 100 MG PO CAPS: 100.0000 mg | ORAL_CAPSULE | Freq: Two times a day (BID) | ORAL | 0 refills | Status: AC

## 2024-10-14 NOTE — Progress Notes (Signed)
 "  WELL-WOMAN EXAMINATION Patient name: Jenna Luna MRN [id number]  Date of birth: 03-14-1981 Chief Complaint:   new annaul (Urine odor and frequency yesterday,took one AZO table)  History of Present Illness:   Jenna Luna is a 44 y.o. G4P0 Caucasian female being seen today for a routine well-woman exam.  Current complaints: urinary frequency/dysuria + odor since yesterday; has had e Coli UTI in the recent past; needs Pap (has been 'years' but never had abnl result); had colonoscopy ~23yrs ago; mammogram in Oct 2025 in [place] (normal); sexually active; s/p BTL; beginning to have some menstrual irregularities and night sweats  PCP: Aldona Pizza NP      does not desire labs Patient's last menstrual period was 10/06/2024. The current method of family planning is tubal ligation.  Last pap 'years ago'. Results were: neg per pt report. H/O abnormal pap: no Last mammogram: Oct 2025. Results were: normal. Family h/o breast cancer: no Last colonoscopy: ~10 yrs ago. Results were: normal. Family h/o colorectal cancer: no     10/14/2024    1:35 PM  Depression screen PHQ 2/9  Decreased Interest 2  Down, Depressed, Hopeless 1  PHQ - 2 Score 3  Altered sleeping 2  Tired, decreased energy 3  Change in appetite 1  Feeling bad or failure about yourself  2  Trouble concentrating 1  Moving slowly or fidgety/restless 0  Suicidal thoughts 0  PHQ-9 Score 12        10/14/2024    1:36 PM  GAD 7 : Generalized Anxiety Score  Nervous, Anxious, on Edge 1  Control/stop worrying 1  Worry too much - different things 2  Trouble relaxing 2  Restless 0  Easily annoyed or irritable 2  Afraid - awful might happen 0  Total GAD 7 Score 8     Review of Systems:   Pertinent items are noted in HPI Denies any headaches, blurred vision, fatigue, shortness of breath, chest pain, abdominal pain, abnormal vaginal discharge/itching/odor/irritation, problems with periods, bowel movements, urination, or  intercourse unless otherwise stated above. Pertinent History Reviewed:  Reviewed past medical,surgical, social and family history.  Reviewed problem list, medications and allergies. Physical Assessment:   Vitals:   10/14/24 1329  BP: 116/75  Pulse: 85  Weight: 272 lb (123.4 kg)  Height: 5' 2 (1.575 m)  Body mass index is 49.75 kg/m.        Physical Examination:   General appearance - well appearing, and in no distress  Mental status - alert, oriented to person, place, and time  Psych:  She has a normal mood and affect  Skin - warm and dry, normal color, no suspicious lesions noted  Chest - effort normal, all lung fields clear to auscultation bilaterally  Heart - normal rate and regular rhythm  Neck:  midline trachea, no thyromegaly or nodules  Breasts - breasts appear normal, no suspicious masses, no skin or nipple changes or  axillary nodes  Abdomen - soft, nontender, nondistended, no masses or organomegaly  Pelvic - VULVA: normal appearing vulva with no masses, tenderness or lesions  VAGINA: normal appearing vagina with normal color and discharge, no lesions  CERVIX: normal appearing cervix without discharge or lesions, no CMT  Thin prep pap is done with HR HPV cotesting  UTERUS: uterus is felt to be normal size, shape, consistency and nontender   ADNEXA: No adnexal masses or tenderness noted.  Rectal - not examined  Extremities:  No swelling or varicosities noted  Chaperone:  Winton Cherry  Results for orders placed or performed in visit on 10/14/24 (from the past 24 hours)  POC Urinalysis Dipstick OB   Collection Time: 10/14/24  2:05 PM  Result Value Ref Range   Color, UA     Clarity, UA     Glucose, UA Negative Negative   Bilirubin, UA     Ketones, UA trace    Spec Grav, UA     Blood, UA negative    pH, UA     POC,PROTEIN,UA Trace Negative, Trace, Small (1+), Moderate (2+), Large (3+), 4+   Urobilinogen, UA     Nitrite, UA postive    Leukocytes, UA Negative  Negative   Appearance     Odor      Assessment & Plan:  1) Well-Woman Exam  2) BMI 49, states was taking Wegovy with about 20lb weight loss before insurance stopped covering, but now is covered again and trying to get on it  3) cHTN, stable BP on lisinopril and propranolol  4) Probable UTI, +nitrites and +symptoms, rx Macrobid  and will send to culture  Labs/procedures today: Pap, UA, urine culture  Mammogram: in 1 year, or sooner if problems Colonoscopy: @ 45yo, or sooner if problems  Orders Placed This Encounter  Procedures   Urine Culture   POC Urinalysis Dipstick OB    Meds:  Meds ordered this encounter  Medications   nitrofurantoin , macrocrystal-monohydrate, (MACROBID ) 100 MG capsule    Sig: Take 1 capsule (100 mg total) by mouth 2 (two) times daily for 7 days.    Dispense:  14 capsule    Refill:  0    Supervising Provider:   MARILYNN NEST [[id number]]    Follow-up: Return for prn.  Suzen JONETTA Gentry CNM 10/14/2024 3:33 PM  "

## 2024-10-16 LAB — URINE CULTURE

## 2024-10-18 ENCOUNTER — Ambulatory Visit: Payer: Self-pay | Admitting: Advanced Practice Midwife

## 2024-10-23 LAB — CYTOLOGY - PAP
Comment: NEGATIVE
Comment: NEGATIVE
Comment: NEGATIVE
HPV 16: POSITIVE — AB
HPV 18 / 45: NEGATIVE
High risk HPV: POSITIVE — AB
